# Patient Record
Sex: Male | Born: 1975 | Race: White | Hispanic: No | Marital: Married | Smoking: Never smoker
Health system: Southern US, Community
[De-identification: ages and names within clinical notes are randomized; demographics above are authoritative.]

## PROBLEM LIST (undated history)

## (undated) DIAGNOSIS — C8111 Nodular sclerosis classical Hodgkin lymphoma, lymph nodes of head, face, and neck: Secondary | ICD-10-CM

## (undated) DIAGNOSIS — Z789 Other specified health status: Secondary | ICD-10-CM

## (undated) HISTORY — PX: NO PAST SURGERIES: SHX2092

---

## 2004-06-21 ENCOUNTER — Emergency Department (HOSPITAL_COMMUNITY): Admission: EM | Admit: 2004-06-21 | Discharge: 2004-06-21 | Payer: Self-pay | Admitting: Family Medicine

## 2005-01-29 ENCOUNTER — Emergency Department (HOSPITAL_COMMUNITY): Admission: EM | Admit: 2005-01-29 | Discharge: 2005-01-29 | Payer: Self-pay | Admitting: Family Medicine

## 2005-02-20 ENCOUNTER — Ambulatory Visit: Payer: Self-pay | Admitting: Family Medicine

## 2005-04-18 ENCOUNTER — Ambulatory Visit: Payer: Self-pay | Admitting: Family Medicine

## 2005-04-24 ENCOUNTER — Ambulatory Visit: Payer: Self-pay

## 2005-04-24 ENCOUNTER — Encounter: Payer: Self-pay | Admitting: Cardiology

## 2005-06-28 ENCOUNTER — Ambulatory Visit: Payer: Self-pay | Admitting: Family Medicine

## 2005-09-12 ENCOUNTER — Ambulatory Visit: Payer: Self-pay | Admitting: Family Medicine

## 2005-09-19 ENCOUNTER — Ambulatory Visit: Payer: Self-pay | Admitting: Family Medicine

## 2005-10-19 ENCOUNTER — Ambulatory Visit: Payer: Self-pay | Admitting: Family Medicine

## 2005-11-27 ENCOUNTER — Ambulatory Visit: Payer: Self-pay | Admitting: Family Medicine

## 2006-04-24 ENCOUNTER — Ambulatory Visit: Payer: Self-pay | Admitting: Family Medicine

## 2006-04-24 LAB — CONVERTED CEMR LAB
Basophils Absolute: 0 10*3/uL (ref 0.0–0.1)
Basophils Relative: 0.6 % (ref 0.0–1.0)
Hemoglobin: 15.1 g/dL (ref 13.0–17.0)
MCV: 88.2 fL (ref 78.0–100.0)
Monocytes Relative: 11 % (ref 3.0–11.0)
Neutro Abs: 2.6 10*3/uL (ref 1.4–7.7)
Platelets: 266 10*3/uL (ref 150–400)
RBC: 4.85 M/uL (ref 4.22–5.81)
WBC: 5.4 10*3/uL (ref 4.5–10.5)

## 2006-04-26 ENCOUNTER — Encounter: Admission: RE | Admit: 2006-04-26 | Discharge: 2006-04-26 | Payer: Self-pay | Admitting: Family Medicine

## 2006-05-22 ENCOUNTER — Ambulatory Visit: Payer: Self-pay | Admitting: Family Medicine

## 2007-04-29 ENCOUNTER — Emergency Department (HOSPITAL_COMMUNITY): Admission: EM | Admit: 2007-04-29 | Discharge: 2007-04-29 | Payer: Self-pay | Admitting: Emergency Medicine

## 2013-12-04 ENCOUNTER — Ambulatory Visit: Payer: Self-pay | Admitting: Family Medicine

## 2016-03-26 DIAGNOSIS — C8111 Nodular sclerosis classical Hodgkin lymphoma, lymph nodes of head, face, and neck: Secondary | ICD-10-CM

## 2016-03-26 HISTORY — DX: Nodular sclerosis Hodgkin lymphoma, lymph nodes of head, face, and neck: C81.11

## 2016-04-06 ENCOUNTER — Other Ambulatory Visit: Payer: Self-pay | Admitting: Family Medicine

## 2016-04-06 DIAGNOSIS — R221 Localized swelling, mass and lump, neck: Secondary | ICD-10-CM

## 2016-04-06 DIAGNOSIS — R222 Localized swelling, mass and lump, trunk: Secondary | ICD-10-CM

## 2016-04-08 DIAGNOSIS — D509 Iron deficiency anemia, unspecified: Secondary | ICD-10-CM | POA: Insufficient documentation

## 2016-04-08 DIAGNOSIS — R030 Elevated blood-pressure reading, without diagnosis of hypertension: Secondary | ICD-10-CM | POA: Insufficient documentation

## 2016-04-09 ENCOUNTER — Ambulatory Visit: Payer: Self-pay

## 2016-04-10 ENCOUNTER — Ambulatory Visit
Admission: RE | Admit: 2016-04-10 | Discharge: 2016-04-10 | Disposition: A | Discharge status: Home / Self Care | Payer: BLUE CROSS/BLUE SHIELD | Source: Ambulatory Visit | Attending: Family Medicine | Admitting: Family Medicine

## 2016-04-10 ENCOUNTER — Encounter: Payer: Self-pay | Admitting: *Deleted

## 2016-04-10 ENCOUNTER — Other Ambulatory Visit: Payer: Self-pay | Admitting: Family Medicine

## 2016-04-10 ENCOUNTER — Other Ambulatory Visit: Payer: Self-pay | Admitting: Oncology

## 2016-04-10 DIAGNOSIS — R222 Localized swelling, mass and lump, trunk: Secondary | ICD-10-CM

## 2016-04-10 DIAGNOSIS — R221 Localized swelling, mass and lump, neck: Secondary | ICD-10-CM | POA: Diagnosis present

## 2016-04-10 DIAGNOSIS — K118 Other diseases of salivary glands: Secondary | ICD-10-CM | POA: Diagnosis not present

## 2016-04-10 DIAGNOSIS — R590 Localized enlarged lymph nodes: Secondary | ICD-10-CM | POA: Diagnosis not present

## 2016-04-10 MED ORDER — IOPAMIDOL (ISOVUE-300) INJECTION 61%
75.0000 mL | Freq: Once | INTRAVENOUS | Status: AC | PRN
  Administered 2016-04-10: 75 mL via INTRAVENOUS

## 2016-04-11 NOTE — Discharge Instructions (Signed)

## 2016-04-13 ENCOUNTER — Encounter: Payer: Self-pay | Admitting: Otolaryngology

## 2016-04-13 ENCOUNTER — Ambulatory Visit
Admission: RE | Admit: 2016-04-13 | Discharge: 2016-04-13 | Disposition: A | Discharge status: Home / Self Care | Payer: BLUE CROSS/BLUE SHIELD | Source: Ambulatory Visit | Attending: Otolaryngology | Admitting: Otolaryngology

## 2016-04-13 ENCOUNTER — Ambulatory Visit: Payer: BLUE CROSS/BLUE SHIELD | Admitting: Anesthesiology

## 2016-04-13 ENCOUNTER — Encounter
Admission: RE | Disposition: A | Discharge status: Home / Self Care | Payer: Self-pay | Source: Ambulatory Visit | Attending: Otolaryngology

## 2016-04-13 DIAGNOSIS — Z87891 Personal history of nicotine dependence: Secondary | ICD-10-CM | POA: Insufficient documentation

## 2016-04-13 DIAGNOSIS — R590 Localized enlarged lymph nodes: Secondary | ICD-10-CM | POA: Insufficient documentation

## 2016-04-13 HISTORY — PX: LYMPH NODE BIOPSY: SHX201

## 2016-04-13 HISTORY — DX: Other specified health status: Z78.9

## 2016-04-13 SURGERY — LYMPH NODE BIOPSY
Anesthesia: General | Laterality: Right | Wound class: Clean

## 2016-04-13 MED ORDER — GLYCOPYRROLATE 0.2 MG/ML IJ SOLN
INTRAMUSCULAR | Status: DC | PRN
  Administered 2016-04-13: 0.2 mg via INTRAVENOUS

## 2016-04-13 MED ORDER — ONDANSETRON HCL 4 MG/2ML IJ SOLN: 4.0000 mg | Freq: Once | INTRAMUSCULAR | Status: DC | PRN

## 2016-04-13 MED ORDER — FENTANYL CITRATE (PF) 100 MCG/2ML IJ SOLN: 25.0000 ug | INTRAMUSCULAR | Status: DC | PRN

## 2016-04-13 MED ORDER — OXYCODONE HCL 5 MG/5ML PO SOLN: 5.0000 mg | Freq: Once | ORAL | Status: DC | PRN

## 2016-04-13 MED ORDER — ONDANSETRON HCL 4 MG/2ML IJ SOLN
INTRAMUSCULAR | Status: DC | PRN
  Administered 2016-04-13: 4 mg via INTRAVENOUS

## 2016-04-13 MED ORDER — SUCCINYLCHOLINE CHLORIDE 20 MG/ML IJ SOLN
INTRAMUSCULAR | Status: DC | PRN
  Administered 2016-04-13: 100 mg via INTRAVENOUS

## 2016-04-13 MED ORDER — ROCURONIUM BROMIDE 100 MG/10ML IV SOLN
INTRAVENOUS | Status: DC | PRN
  Administered 2016-04-13: 10 mg via INTRAVENOUS

## 2016-04-13 MED ORDER — PROPOFOL 10 MG/ML IV BOLUS
INTRAVENOUS | Status: DC | PRN
  Administered 2016-04-13: 100 mg via INTRAVENOUS

## 2016-04-13 MED ORDER — FENTANYL CITRATE (PF) 100 MCG/2ML IJ SOLN
INTRAMUSCULAR | Status: DC | PRN
  Administered 2016-04-13: 100 ug via INTRAVENOUS

## 2016-04-13 MED ORDER — MIDAZOLAM HCL 5 MG/5ML IJ SOLN
INTRAMUSCULAR | Status: DC | PRN
  Administered 2016-04-13: 2 mg via INTRAVENOUS

## 2016-04-13 MED ORDER — LIDOCAINE-EPINEPHRINE 2 %-1:100000 IJ SOLN
INTRAMUSCULAR | Status: DC | PRN
  Administered 2016-04-13: 5.5 mL

## 2016-04-13 MED ORDER — LACTATED RINGERS IV SOLN
INTRAVENOUS | Status: DC
  Administered 2016-04-13: 12:00:00 via INTRAVENOUS

## 2016-04-13 MED ORDER — DEXAMETHASONE SODIUM PHOSPHATE 4 MG/ML IJ SOLN
INTRAMUSCULAR | Status: DC | PRN
  Administered 2016-04-13: 8 mg via INTRAVENOUS

## 2016-04-13 MED ORDER — LIDOCAINE HCL (CARDIAC) 20 MG/ML IV SOLN
INTRAVENOUS | Status: DC | PRN
  Administered 2016-04-13: 50 mg via INTRATRACHEAL

## 2016-04-13 MED ORDER — OXYCODONE HCL 5 MG PO TABS: 5.0000 mg | ORAL_TABLET | Freq: Once | ORAL | Status: DC | PRN

## 2016-04-13 SURGICAL SUPPLY — 39 items
BLADE SURG 15 STRL LF DISP TIS (BLADE) ×1 IMPLANT
BLADE SURG 15 STRL SS (BLADE) ×2
CNTNR SPEC 2.5X3XGRAD LEK (MISCELLANEOUS) ×1
CONT SPEC 4OZ STER OR WHT (MISCELLANEOUS) ×2
CONTAINER SPEC 2.5X3XGRAD LEK (MISCELLANEOUS) ×1 IMPLANT
CORD BIP STRL DISP 12FT (MISCELLANEOUS) IMPLANT
DRAPE MAG INST 16X20 L/F (DRAPES) ×3 IMPLANT
DRESSING TELFA 4X3 1S ST N-ADH (GAUZE/BANDAGES/DRESSINGS) ×6 IMPLANT
DRSG TEGADERM 2-3/8X2-3/4 SM (GAUZE/BANDAGES/DRESSINGS) IMPLANT
DRSG TEGADERM 4X4.75 (GAUZE/BANDAGES/DRESSINGS) IMPLANT
ELECT CAUTERY BLADE TIP 2.5 (TIP)
ELECT NEEDLE 20X.3 GREEN (MISCELLANEOUS) ×3
ELECT REM PT RETURN 9FT ADLT (ELECTROSURGICAL)
ELECTRODE CAUTERY BLDE TIP 2.5 (TIP) IMPLANT
ELECTRODE NEEDLE 20X.3 GREEN (MISCELLANEOUS) ×1 IMPLANT
ELECTRODE REM PT RTRN 9FT ADLT (ELECTROSURGICAL) IMPLANT
GLOVE BIO SURGEON STRL SZ7.5 (GLOVE) ×3 IMPLANT
GLOVE PROTEXIS LATEX SZ 7.5 (GLOVE) IMPLANT
GOWN STRL REUS W/ TWL LRG LVL3 (GOWN DISPOSABLE) ×2 IMPLANT
GOWN STRL REUS W/TWL LRG LVL3 (GOWN DISPOSABLE) ×4
HARMONIC SCALPEL FOCUS (MISCELLANEOUS) ×3 IMPLANT
LABEL OR SOLS (LABEL) IMPLANT
NS IRRIG 500ML POUR BTL (IV SOLUTION) ×3 IMPLANT
PACK HEAD/NECK (MISCELLANEOUS) ×3 IMPLANT
PROBE MONO 100X0.75 ELECT 1.9M (MISCELLANEOUS) ×3 IMPLANT
PROBE NEUROSIGN BIPOL (MISCELLANEOUS) ×1 IMPLANT
PROBE NEUROSIGN BIPOLAR (MISCELLANEOUS) ×2
SOL PREP PVP 2OZ (MISCELLANEOUS) ×3
SOLUTION PREP PVP 2OZ (MISCELLANEOUS) ×1 IMPLANT
SPONGE KITTNER 5P (MISCELLANEOUS) ×3 IMPLANT
SPONGE XRAY 4X4 16PLY STRL (MISCELLANEOUS) ×3 IMPLANT
SUT ETHILON 5-0 FS-2 18 BLK (SUTURE) IMPLANT
SUT ETHILON 6 0 9-3 1X18 BLK (SUTURE) IMPLANT
SUT PROLENE 5 0 P 3 (SUTURE) ×3 IMPLANT
SUT SILK 2 0 (SUTURE)
SUT SILK 2-0 18XBRD TIE 12 (SUTURE) IMPLANT
SUT SILK 3 0 REEL (SUTURE) IMPLANT
SUT VIC AB 4-0 PS2 18 (SUTURE) IMPLANT
SUT VIC AB 4-0 RB1 18 (SUTURE) ×3 IMPLANT

## 2016-04-13 NOTE — Anesthesia Postprocedure Evaluation (Signed)
Anesthesia Post Note  Patient: Wyatt Bates  Procedure(s) Performed: Procedure(s) (LRB): biopsy of lymph nodes open deep cervical node (right) (Right)  Patient location during evaluation: PACU Anesthesia Type: General Level of consciousness: awake and alert Pain management: pain level controlled Vital Signs Assessment: post-procedure vital signs reviewed and stable Respiratory status: spontaneous breathing, nonlabored ventilation, respiratory function stable and patient connected to nasal cannula oxygen Cardiovascular status: blood pressure returned to baseline and stable Postop Assessment: no signs of nausea or vomiting Anesthetic complications: no    Alisa Graff

## 2016-04-13 NOTE — Transfer of Care (Signed)
Immediate Anesthesia Transfer of Care Note  Patient: Wyatt Bates  Procedure(s) Performed: Procedure(s): biopsy of lymph nodes open deep cervical node (right) (Right)  Patient Location: PACU  Anesthesia Type: General  Level of Consciousness: awake, alert  and patient cooperative  Airway and Oxygen Therapy: Patient Spontanous Breathing and Patient connected to supplemental oxygen  Post-op Assessment: Post-op Vital signs reviewed, Patient's Cardiovascular Status Stable, Respiratory Function Stable, Patent Airway and No signs of Nausea or vomiting  Post-op Vital Signs: Reviewed and stable  Complications: No apparent anesthesia complications

## 2016-04-13 NOTE — Anesthesia Procedure Notes (Addendum)
Procedure Name: Intubation Performed by: Londell Moh Pre-anesthesia Checklist: Patient identified, Emergency Drugs available, Suction available, Patient being monitored and Timeout performed Patient Re-evaluated:Patient Re-evaluated prior to inductionOxygen Delivery Method: Circle system utilized Preoxygenation: Pre-oxygenation with 100% oxygen Intubation Type: IV induction, Rapid sequence and Cricoid Pressure applied Laryngoscope Size: Glidescope and 3 Grade View: Grade I Tube type: Oral Rae Tube size: 7.5 mm Number of attempts: 1 Airway Equipment and Method: Rigid stylet Placement Confirmation: ETT inserted through vocal cords under direct vision,  positive ETCO2 and breath sounds checked- equal and bilateral Tube secured with: Tape Dental Injury: Teeth and Oropharynx as per pre-operative assessment  Difficulty Due To: Difficulty was anticipated and Difficult Airway- due to limited oral opening Comments: Pt trachea deviated to left. og placed stomach decompressed.

## 2016-04-13 NOTE — H&P (Signed)
H&P has been reviewed and added heart and lung exam were normal. To be downloaded later.

## 2016-04-13 NOTE — Anesthesia Preprocedure Evaluation (Signed)
Anesthesia Evaluation  Patient identified by MRN, date of birth, ID band Patient awake    Reviewed: Allergy & Precautions, H&P , NPO status , Patient's Chart, lab work & pertinent test results, reviewed documented beta blocker date and time   Airway Mallampati: II  TM Distance: >3 FB Neck ROM: full    Dental no notable dental hx.    Pulmonary former smoker,    Pulmonary exam normal breath sounds clear to auscultation       Cardiovascular Exercise Tolerance: Good negative cardio ROS   Rhythm:regular Rate:Normal     Neuro/Psych negative neurological ROS  negative psych ROS   GI/Hepatic negative GI ROS, Neg liver ROS,   Endo/Other  negative endocrine ROS  Renal/GU negative Renal ROS  negative genitourinary   Musculoskeletal   Abdominal   Peds  Hematology negative hematology ROS (+)   Anesthesia Other Findings   Reproductive/Obstetrics negative OB ROS                             Anesthesia Physical Anesthesia Plan  ASA: II  Anesthesia Plan: General   Post-op Pain Management:    Induction:   Airway Management Planned:   Additional Equipment:   Intra-op Plan:   Post-operative Plan:   Informed Consent: I have reviewed the patients History and Physical, chart, labs and discussed the procedure including the risks, benefits and alternatives for the proposed anesthesia with the patient or authorized representative who has indicated his/her understanding and acceptance.   Dental Advisory Given  Plan Discussed with: CRNA  Anesthesia Plan Comments:         Anesthesia Quick Evaluation

## 2016-04-13 NOTE — Op Note (Signed)
.  04/13/2016  1:50 PM    Colt, Zysk  TL:[id number]   Pre-Op Dx:  Massive right neck lymphadenopathy with suspicion of lymphoma  Post-op Dx: Same  Proc: Right deep neck node biopsy   Surg:  [facility]  Anes:  GOT  EBL:  10 mL  Comp:  None  Findings:  Very enlarged lymph nodes well encapsulated that are matted together. 2 neck nodes from anterior and under the sternocleidomastoid were removed.  Procedure: The patient was given general anesthesia by oral endotracheal intubation. He is placed in a supine position with a shoulder roll were his neck was extended a little bed and had rotated to the left side. You feel lymph nodes scattered throughout the neck behind the sternocleidomastoid muscle and in front of it and under it from under the angle jaw down to the clavicle. He was prepped and draped in sterile fashion. 5-1/2 mL 2% lidocaine with epi 1-100,000 were used for infiltration around the skin for vasoconstriction. A skin crease was marked for a to have centimeter incision in a horizontal fashion.  The incision was created through skin and subcutaneous tissue down the platysma. This was divided with the Harmonic and the sternal cleidomastoid muscle was evident. The muscle fibers were freed up from the underlying lymph node and were pulled posteriorly to find a nice plane around the lymph node. The lymph node had a very dense capsule that was whitish. The dissection was carried all the way around it freed up the scar bands small vessels with the Harmonic scalpel. Inside got the dome and freed up and freed all around each side, you could see where it was adherent to the adjacent lymph nodes with a thick capsule. The capsule was divided to free up the outer lymph node without trying to remove all of the. I was able with the Harmonic to separate the lymph nodes was still an intact capsule on both sides. Once all the inferior attachments were freed up see another smaller lymph node that was  sticking into the hole with very little inferior attachments area these were freed up and a second lymph node was sent. The first lymph node was about 4 cm in length and 2-1/2 cm in diameter in a tube shape. The smaller lymph node was about 2 cm in length and 1 cm in diameter. When these were both removed from the area there was very little bleeding at all. Wound was irrigated copiously and no bleeding sites were noted.  A 10 TLS drain was placed through separate posterior stab incision. The platysma was closed with 40 Vicryls and then the subcutaneous was closed with 40 Vicryls as well. The skin edges were held together with a running locking 5-0 Prolene suture. The drain was placed to low continuous Vacutainer suction. The wound was covered with bacitracin, Telfa, and Tegaderm. The patient was awakened and taken to the recovery room in satisfactory condition. There were no operative complications.  Dispo:   To PACU to be discharged home  Plan:  To follow-up tomorrow for drain removal and changing his dressing. He will be given Vacutainer's to change that every 4 hours or sooner, if the Vacutainer gets one half full.  Melodye Swor H  04/13/2016 1:50 PM

## 2016-04-16 ENCOUNTER — Encounter: Payer: Self-pay | Admitting: Otolaryngology

## 2016-04-17 NOTE — Progress Notes (Signed)
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 Subsequent imaging and biopsy was consistent with Hodgkin's lymphoma. He otherwise has felt well. He denies any fevers, night sweats, or weight loss. He has no neurologic complaints. He denies any dysphagia or difficulty swallowing. He has no chest pain or shortness of breath. He denies any nausea, vomiting, constipation, or diarrhea. He has no urinary complaints. Patient otherwise feels well and offers no further specific complaints.  REVIEW OF SYSTEMS:   Review of Systems  Constitutional: Negative.  Negative for fever, malaise/fatigue and weight loss.  HENT: Negative.   Respiratory: Negative.  Negative for cough and shortness of breath.   Cardiovascular: Negative.  Negative for chest pain and leg swelling.  Gastrointestinal: Negative.  Negative for abdominal pain.  Genitourinary: Negative.   Musculoskeletal: Positive for neck pain.  Neurological: Negative.  Negative for weakness.  Psychiatric/Behavioral: The patient is nervous/anxious.     As per HPI. Otherwise, a complete review of systems is negative.  PAST MEDICAL HISTORY: Past Medical History:  Diagnosis Date  . Medical history non-contributory     PAST SURGICAL HISTORY: Past Surgical History:  Procedure Laterality Date  . LYMPH NODE BIOPSY Right 04/13/2016   Procedure: biopsy of lymph nodes open deep cervical node (right);  Surgeon: Margaretha Sheffield, MD;  Location: [facility];  Service: ENT;  Laterality: Right;  . NO PAST SURGERIES      FAMILY HISTORY: No family history on file.  ADVANCED DIRECTIVES (Y/N):  N  HEALTH  MAINTENANCE: Social History  Substance Use Topics  . Smoking status: Former Research scientist (life sciences)  . Smokeless tobacco: Former Systems developer    Types: Snuff    Quit date: 05/25/2015     Comment: social in college  . Alcohol use 1.2 oz/week    2 Glasses of wine per week     Colonoscopy:  PAP:  Bone density:  Lipid panel:  No Known Allergies  No current outpatient prescriptions on file.   No current facility-administered medications for this visit.     OBJECTIVE: There were no vitals filed for this visit.   There is no height or weight on file to calculate BMI.    ECOG FS:0 - Asymptomatic  General: Well-developed, well-nourished, no acute distress. Eyes: Pink conjunctiva, anicteric sclera. HEENT: Large easily palpable right neck mass consistent with lymphadenopathy. Lungs: Clear to auscultation bilaterally. Heart: Regular rate and rhythm. No rubs, murmurs, or gallops. Abdomen: Soft, nontender, nondistended. No organomegaly noted, normoactive bowel sounds. Musculoskeletal: No edema, cyanosis, or clubbing. Neuro: Alert, answering all questions appropriately. Cranial nerves grossly intact. Skin: No rashes or petechiae noted. Psych: Normal affect. Lymphatics: No cervical, calvicular, axillary or inguinal LAD.   LAB RESULTS:  No results found for: NA, K, CL, CO2, GLUCOSE, BUN, CREATININE, CALCIUM, PROT, ALBUMIN, AST, ALT, ALKPHOS, BILITOT, GFRNONAA, GFRAA  Lab Results  Component Value Date   WBC 5.4 04/24/2006   NEUTROABS 2.6 04/24/2006   HGB 15.1 04/24/2006   HCT 42.8 04/24/2006   MCV 88.2 04/24/2006   PLT 266 04/24/2006     STUDIES: Ct Soft Tissue Neck W Contrast  Result Date: 04/10/2016 CLINICAL DATA:  Subcutaneous mass in the supraclavicular area. EXAM: CT NECK WITH  CONTRAST TECHNIQUE: Multidetector CT imaging of the neck was performed using the standard protocol following the bolus administration of intravenous contrast. CONTRAST:  47m ISOVUE-300 IOPAMIDOL (ISOVUE-300) INJECTION 61%  COMPARISON:  None. FINDINGS: Pharynx and larynx: Negative for mass or swelling. The central compartment is displaced to the left by bulky cervical lymphadenopathy. No thickening of the tonsillar tissue. Salivary glands: 2 or 3 foci of gas within the right parotid without inflammatory changes. Thyroid: Negative Lymph nodes: Bulky lymphadenopathy throughout the right cervical chain extending from the parotid tail to the supraclavicular fossa. The largest node is at the level of the hyoid and measures up to 42 mm in diameter. The nodes are homogeneously enhancing and rounded. No contralateral lymphadenopathy. Vascular: Effacement and displacement of the right internal jugular vein without thrombosis. Negative arterial structures. Limited intracranial: Negative Visualized orbits: Negative Mastoids and visualized paranasal sinuses: Clear Skeleton: No acute or aggressive process. Upper chest: Reported separately These results will be called to the ordering clinician or representative by the Radiologist Assistant, and communication documented in the PACS or zVision Dashboard. IMPRESSION: 1. Bulky lymphadenopathy in the right neck consistent with lymphoma. Adenopathy extends from the parotid tail to the supraclavicular fossa and causes leftward displacement of the airway. 2. Right pneumoparotid. Electronically Signed   By: JMonte FantasiaM.D.   On: 04/10/2016 11:25   Ct Chest W Contrast  Result Date: 04/10/2016 CLINICAL DATA:  Enlarging right supraclavicular neck mass. EXAM: CT CHEST WITH CONTRAST TECHNIQUE: Multidetector CT imaging of the chest was performed during intravenous contrast administration. CONTRAST:  75 mL Isovue 300 COMPARISON:  None. FINDINGS: Cardiovascular:  No acute findings. Mediastinum/Nodes: Right lower jugular and supraclavicular lymphadenopathy is incompletely visualized on this exam. 10 mm subcarinal mediastinal lymph node seen on image 57/4. A 10 mm mediastinal lymph node is also seen along the  anterior aspect of the lower thoracic esophagus on image 107/4. No evidence of hilar or axillary lymphadenopathy. Lungs/Pleura: No pulmonary infiltrate or mass identified. No effusion present. Upper Abdomen:  Unremarkable. Musculoskeletal:  No suspicious bone lesions. IMPRESSION: Incompletely visualized right lower jugular and supraclavicular lymphadenopathy. See separate neck CT report from today. 10 mm subcarinal and lower paraesophageal mediastinal lymph nodes. No other lymphadenopathy or soft tissue masses identified within the thorax. Electronically Signed   By: JEarle GellM.D.   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 Patient has at least stage II disease, but will get a PET scan as well as bone marrow biopsy in the next 1-2 weeks to complete the staging workup. Given the bulky nature of his neck mass, he likely will benefit from chemotherapy using ABVD. Patient will also require MUGA scan and PFTs in preparation for treatment. Patient will return to clinic one week after his biopsy to discuss the results and treatment planning.  Approximately 45 minutes was spent in discussion of which greater than 50% was consultation.  Patient expressed understanding and was in agreement with this plan. He also understands that He can call clinic at any time with any questions, concerns, or complaints.   Cancer Staging No matching staging information was found for the patient.  TLloyd Huger MD   04/17/2016 10:50 PM

## 2016-04-18 ENCOUNTER — Ambulatory Visit (INDEPENDENT_AMBULATORY_CARE_PROVIDER_SITE_OTHER): Payer: BLUE CROSS/BLUE SHIELD | Admitting: Primary Care

## 2016-04-18 ENCOUNTER — Encounter: Payer: Self-pay | Admitting: Primary Care

## 2016-04-18 ENCOUNTER — Inpatient Hospital Stay: Payer: BLUE CROSS/BLUE SHIELD | Attending: Oncology | Admitting: Oncology

## 2016-04-18 ENCOUNTER — Encounter: Payer: Self-pay | Admitting: Oncology

## 2016-04-18 VITALS — BP 149/95 | HR 91 | Temp 97.6°F | Resp 18 | Ht 66.5 in | Wt 213.0 lb

## 2016-04-18 DIAGNOSIS — R221 Localized swelling, mass and lump, neck: Secondary | ICD-10-CM | POA: Diagnosis not present

## 2016-04-18 DIAGNOSIS — C8111 Nodular sclerosis classical Hodgkin lymphoma, lymph nodes of head, face, and neck: Secondary | ICD-10-CM

## 2016-04-18 DIAGNOSIS — Z87891 Personal history of nicotine dependence: Secondary | ICD-10-CM | POA: Insufficient documentation

## 2016-04-18 DIAGNOSIS — R030 Elevated blood-pressure reading, without diagnosis of hypertension: Secondary | ICD-10-CM | POA: Diagnosis not present

## 2016-04-18 DIAGNOSIS — C8591 Non-Hodgkin lymphoma, unspecified, lymph nodes of head, face, and neck: Secondary | ICD-10-CM | POA: Insufficient documentation

## 2016-04-18 DIAGNOSIS — K118 Other diseases of salivary glands: Secondary | ICD-10-CM | POA: Insufficient documentation

## 2016-04-18 NOTE — Assessment & Plan Note (Signed)
Historical elevated readings in MD office's. Overall stable home readings. Will allow him to work on lifestyle changes, long discussion today regarding what that entailed. Info provided regarding DASH diet. Follow up in 2 months for re-evaluation.

## 2016-04-18 NOTE — Patient Instructions (Signed)
Check your blood pressure daily, around the same time of day.  Ensure that you have rested for 30 minutes prior to checking your blood pressure. Take note of your readings.  Schedule a follow up visit in 2 months for re-evaluation of your blood pressure.  Continue to work on improvements in your diet. Increase vegetables, fruit, whole grains, water.  Start exercising. You should be getting 150 minutes of moderate intensity exercise weekly.  Ensure you are consuming 64 ounces of water daily.  Take a look at the information below.  It was a pleasure to meet you today! Please don't hesitate to call me with any questions. Welcome to [facility]!  DASH Eating Plan DASH stands for "Dietary Approaches to Stop Hypertension." The DASH eating plan is a healthy eating plan that has been shown to reduce high blood pressure (hypertension). Additional health benefits may include reducing the risk of type 2 diabetes mellitus, heart disease, and stroke. The DASH eating plan may also help with weight loss. What do I need to know about the DASH eating plan? For the DASH eating plan, you will follow these general guidelines:  Choose foods with less than 150 milligrams of sodium per serving (as listed on the food label).  Use salt-free seasonings or herbs instead of table salt or sea salt.  Check with your health care provider or pharmacist before using salt substitutes.  Eat lower-sodium products. These are often labeled as "low-sodium" or "no salt added."  Eat fresh foods. Avoid eating a lot of canned foods.  Eat more vegetables, fruits, and low-fat dairy products.  Choose whole grains. Look for the word "whole" as the first word in the ingredient list.  Choose fish and skinless chicken or [place] more often than red meat. Limit fish, poultry, and meat to 6 oz (170 g) each day.  Limit sweets, desserts, sugars, and sugary drinks.  Choose heart-healthy fats.  Eat more home-cooked food and less  restaurant, buffet, and fast food.  Limit fried foods.  Do not fry foods. Cook foods using methods such as baking, boiling, grilling, and broiling instead.  When eating at a restaurant, ask that your food be prepared with less salt, or no salt if possible. What foods can I eat? Seek help from a dietitian for individual calorie needs. Grains  Whole grain or whole wheat bread. Brown rice. Whole grain or whole wheat pasta. Quinoa, bulgur, and whole grain cereals. Low-sodium cereals. Corn or whole wheat flour tortillas. Whole grain cornbread. Whole grain crackers. Low-sodium crackers. Vegetables  Fresh or frozen vegetables (raw, steamed, roasted, or grilled). Low-sodium or reduced-sodium tomato and vegetable juices. Low-sodium or reduced-sodium tomato sauce and paste. Low-sodium or reduced-sodium canned vegetables. Fruits  All fresh, canned (in natural juice), or frozen fruits. Meat and Other Protein Products  Ground beef (85% or leaner), grass-fed beef, or beef trimmed of fat. Skinless chicken or [place]. Ground chicken or [place]. Pork trimmed of fat. All fish and seafood. Eggs. Dried beans, peas, or lentils. Unsalted nuts and seeds. Unsalted canned beans. Dairy  Low-fat dairy products, such as skim or 1% milk, 2% or reduced-fat cheeses, low-fat ricotta or cottage cheese, or plain low-fat yogurt. Low-sodium or reduced-sodium cheeses. Fats and Oils  Tub margarines without trans fats. Light or reduced-fat mayonnaise and salad dressings (reduced sodium). Avocado. Safflower, olive, or canola oils. Natural peanut or almond butter. Other  Unsalted popcorn and pretzels. The items listed above may not be a complete list of recommended foods or beverages. Contact your  dietitian for more options.  What foods are not recommended? Grains  White bread. White pasta. White rice. Refined cornbread. Bagels and croissants. Crackers that contain trans fat. Vegetables  Creamed or fried vegetables. Vegetables in  a cheese sauce. Regular canned vegetables. Regular canned tomato sauce and paste. Regular tomato and vegetable juices. Fruits  Canned fruit in light or heavy syrup. Fruit juice. Meat and Other Protein Products  Fatty cuts of meat. Ribs, chicken wings, bacon, sausage, bologna, salami, chitterlings, fatback, hot dogs, bratwurst, and packaged luncheon meats. Salted nuts and seeds. Canned beans with salt. Dairy  Whole or 2% milk, cream, half-and-half, and cream cheese. Whole-fat or sweetened yogurt. Full-fat cheeses or blue cheese. Nondairy creamers and whipped toppings. Processed cheese, cheese spreads, or cheese curds. Condiments  Onion and garlic salt, seasoned salt, table salt, and sea salt. Canned and packaged gravies. Worcestershire sauce. Tartar sauce. Barbecue sauce. Teriyaki sauce. Soy sauce, including reduced sodium. Steak sauce. Fish sauce. Oyster sauce. Cocktail sauce. Horseradish. Ketchup and mustard. Meat flavorings and tenderizers. Bouillon cubes. Hot sauce. Tabasco sauce. Marinades. Taco seasonings. Relishes. Fats and Oils  Butter, stick margarine, lard, shortening, ghee, and bacon fat. Coconut, palm kernel, or palm oils. Regular salad dressings. Other  Pickles and olives. Salted popcorn and pretzels. The items listed above may not be a complete list of foods and beverages to avoid. Contact your dietitian for more information.  Where can I find more information? National Heart, Lung, and Blood Institute: travelstabloid.com This information is not intended to replace advice given to you by your health care provider. Make sure you discuss any questions you have with your health care provider. Document Released: 03/01/2011 Document Revised: 08/18/2015 Document Reviewed: 01/14/2013 Elsevier Interactive Patient Education  2017 [employer].

## 2016-04-18 NOTE — Progress Notes (Signed)
Subjective:    Patient ID: Wyatt Bates, male    DOB: May 02, 1975, 41 y.o.   MRN: [id number]  HPI  Wyatt Bates is a 41 year old male who presents today to establish care and discuss the problems mentioned below. Will obtain old records.  1) Elevated Blood Pressure Reading: History of elevated readings in doctor's offices mostly. He has a strong family history of hypertension in both brothers and father. He was managed on medication in the past for about 1 year during his wife's pregnancy. He checks his BP at work and gets readings of 130's/80-90's on average.  He's recently changed his diet and is very motivated to lose weight. He denies chest pain, dizziness, visual changes.  2) Neck Mass: Located to right neck that was first noticed for several years ago. Over the years he's noticed increased swelling so several weeks ago he presented to urgent care for further evaluation.  He underwent surgery for lymph node removal Friday last week. He is meeting with an oncologist later today for further evaluation of possible of lymphoma. He is currently managed on Norco PRN.  Review of Systems  Constitutional: Negative for fever and unexpected weight change.  HENT: Negative for congestion.   Eyes: Negative for visual disturbance.  Respiratory: Negative for shortness of breath.   Cardiovascular: Negative for chest pain.  Neurological: Negative for dizziness and headaches.  Hematological: Positive for adenopathy.       Past Medical History:  Diagnosis Date  . Medical history non-contributory      Social History   Social History  . Marital status: Married    Spouse name: N/A  . Number of children: N/A  . Years of education: N/A   Occupational History  . Not on file.   Social History Main Topics  . Smoking status: Former Research scientist (life sciences)  . Smokeless tobacco: Former Systems developer    Types: Snuff    Quit date: 05/25/2015     Comment: social in college  . Alcohol use 1.2 oz/week    2 Glasses of wine per  week  . Drug use: Unknown  . Sexual activity: Not on file   Other Topics Concern  . Not on file   Social History Narrative   Married.   2 children.   Works in [employer].   Enjoys golf, baseball, hunting, spending time with family.    Past Surgical History:  Procedure Laterality Date  . LYMPH NODE BIOPSY Right 04/13/2016   Procedure: biopsy of lymph nodes open deep cervical node (right);  Surgeon: Margaretha Sheffield, MD;  Location: [place];  Service: ENT;  Laterality: Right;  . NO PAST SURGERIES      Family History  Problem Relation Age of Onset  . Heart disease Father   . COPD Father   . Hypertension Father   . Hypertension Brother   . Alzheimer's disease Maternal Grandmother   . Pancreatic cancer Maternal Grandfather     No Known Allergies  No current outpatient prescriptions on file prior to visit.   No current facility-administered medications on file prior to visit.     BP (!) 144/92   Pulse 92   Temp 98.1 F (36.7 C) (Oral)   Ht 5' 6.5" (1.689 m)   Wt 213 lb (96.6 kg)   SpO2 97%   BMI 33.86 kg/m    Objective:   Physical Exam  Constitutional: He is oriented to person, place, and time. He appears well-nourished.  Neck: Neck supple.  Cardiovascular:  Normal rate and regular rhythm.   Pulmonary/Chest: Effort normal and breath sounds normal. He has no wheezes. He has no rales.  Lymphadenopathy:    He has cervical adenopathy.  Neurological: He is alert and oriented to person, place, and time.  Skin: Skin is warm and dry.  Psychiatric: He has a normal mood and affect.          Assessment & Plan:

## 2016-04-18 NOTE — Assessment & Plan Note (Signed)
Removal of 2 right sided lymph nodes Friday last week. Will be seeing oncologist today for further evaluation. Surgical site appears well.

## 2016-04-18 NOTE — Progress Notes (Signed)
Patient is here for new patient appointment.  

## 2016-04-18 NOTE — Progress Notes (Signed)
Pre visit review using our clinic review tool, if applicable. No additional management support is needed unless otherwise documented below in the visit note. 

## 2016-04-20 ENCOUNTER — Other Ambulatory Visit: Payer: Self-pay | Admitting: Pathology

## 2016-04-20 LAB — SURGICAL PATHOLOGY

## 2016-04-22 DIAGNOSIS — C8111 Nodular sclerosis classical Hodgkin lymphoma, lymph nodes of head, face, and neck: Secondary | ICD-10-CM | POA: Insufficient documentation

## 2016-04-24 ENCOUNTER — Ambulatory Visit (HOSPITAL_COMMUNITY): Payer: BLUE CROSS/BLUE SHIELD

## 2016-04-24 ENCOUNTER — Encounter: Payer: Self-pay | Admitting: Otolaryngology

## 2016-04-24 DIAGNOSIS — R911 Solitary pulmonary nodule: Secondary | ICD-10-CM | POA: Diagnosis not present

## 2016-04-24 DIAGNOSIS — C8111 Nodular sclerosis classical Hodgkin lymphoma, lymph nodes of head, face, and neck: Secondary | ICD-10-CM

## 2016-04-24 DIAGNOSIS — R590 Localized enlarged lymph nodes: Secondary | ICD-10-CM | POA: Diagnosis not present

## 2016-04-25 ENCOUNTER — Ambulatory Visit
Admission: RE | Admit: 2016-04-25 | Discharge: 2016-04-25 | Disposition: A | Discharge status: Home / Self Care | Payer: BLUE CROSS/BLUE SHIELD | Source: Ambulatory Visit | Attending: Oncology | Admitting: Oncology

## 2016-04-25 DIAGNOSIS — R590 Localized enlarged lymph nodes: Secondary | ICD-10-CM | POA: Insufficient documentation

## 2016-04-25 DIAGNOSIS — C8111 Nodular sclerosis classical Hodgkin lymphoma, lymph nodes of head, face, and neck: Secondary | ICD-10-CM | POA: Diagnosis not present

## 2016-04-25 DIAGNOSIS — R911 Solitary pulmonary nodule: Secondary | ICD-10-CM | POA: Insufficient documentation

## 2016-04-25 LAB — GLUCOSE, CAPILLARY: GLUCOSE-CAPILLARY: 95 mg/dL (ref 65–99)

## 2016-04-25 MED ORDER — FLUDEOXYGLUCOSE F - 18 (FDG) INJECTION
12.0000 | Freq: Once | INTRAVENOUS | Status: AC | PRN
  Administered 2016-04-25: 12.54 via INTRAVENOUS

## 2016-04-30 ENCOUNTER — Other Ambulatory Visit: Payer: Self-pay | Admitting: Oncology

## 2016-04-30 ENCOUNTER — Telehealth: Payer: Self-pay | Admitting: *Deleted

## 2016-04-30 NOTE — Telephone Encounter (Signed)
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 Will discuss details at next clinic visit.    ----- Message ----- From: Telford Nab, RN Sent: 04/30/2016  12:34 PM To: Lloyd Huger, MD  Pt would like PET results prior to appt. Please let me know what you want me to tell him. Thanks!  ----- Message ----- From: Reeves Dam Sent: 04/30/2016  11:59 AM To: Telford Nab, RN  Pt is calling inquiring about the PET scan he calls me quite often. I told him I would ask you to find out if we can tell him results he is very anxious

## 2016-04-30 NOTE — Telephone Encounter (Signed)
Pt has been made aware of PET results per Dr. Grayland Ormond.

## 2016-04-30 NOTE — Progress Notes (Signed)
START ON PATHWAY REGIMEN - Lymphoma and CLL  LYOS310: ABVD q28 Days x 2 Cycles Followed by PET-2   A cycle is every 28 days:     Doxorubicin (Adriamycin(R)) 25 mg/m2 IV push on days 1 and 15. Dose Mod: None     Dacarbazine 375 mg/m2 in 250 mL D5W IV over 60 minutes on days 1 and 15. Dose Mod: None     Vinblastine (Velban(R)) 6 mg/m2 in 50 ml NS IV over 20 minutes on days 1 and 15. Dose Mod: None     Bleomycin (Blenoxane(R)) 10 mg/m2 in 50 mL NS IV over 20 minutes on days 1 and 15. Dose Mod: None Additional Orders: Give bleomycin 1 mg subcut test dose prior to first dose only. Observe pt for 1 hour; if no adverse reaction, proceed with scheduled dose. Bleomycin: 1 unit = 1 mg. PFTs at baseline and as clinically indicated. Ref:  Hollie [place] al. Alison Stalling J Med. 2012 Feb 2;366(5):399-408.  **Always confirm dose/schedule in your pharmacy ordering system**    Patient Characteristics: Classic Hodgkin Lymphoma, First Line, Stage III / IV Disease Type: Classic Hodgkin Lymphoma Disease Type: Not Applicable Line of therapy: First Line [place] Stage: IIIA  Intent of Therapy: Curative Intent, Discussed with Patient

## 2016-05-01 ENCOUNTER — Encounter: Payer: Self-pay | Admitting: *Deleted

## 2016-05-01 ENCOUNTER — Ambulatory Visit
Admission: RE | Admit: 2016-05-01 | Discharge: 2016-05-01 | Disposition: A | Discharge status: Home / Self Care | Payer: BLUE CROSS/BLUE SHIELD | Source: Ambulatory Visit | Attending: Oncology | Admitting: Oncology

## 2016-05-01 DIAGNOSIS — C8111 Nodular sclerosis classical Hodgkin lymphoma, lymph nodes of head, face, and neck: Secondary | ICD-10-CM

## 2016-05-01 MED ORDER — TECHNETIUM TC 99M-LABELED RED BLOOD CELLS IV KIT
20.0000 | PACK | Freq: Once | INTRAVENOUS | Status: AC | PRN
  Administered 2016-05-01: 19.38 via INTRAVENOUS

## 2016-05-02 ENCOUNTER — Other Ambulatory Visit: Payer: Self-pay | Admitting: Radiology

## 2016-05-03 ENCOUNTER — Ambulatory Visit
Admission: RE | Admit: 2016-05-03 | Discharge: 2016-05-03 | Disposition: A | Discharge status: Home / Self Care | Payer: BLUE CROSS/BLUE SHIELD | Source: Ambulatory Visit | Attending: Oncology | Admitting: Oncology

## 2016-05-03 ENCOUNTER — Encounter: Payer: Self-pay | Admitting: Oncology

## 2016-05-03 DIAGNOSIS — C8111 Nodular sclerosis classical Hodgkin lymphoma, lymph nodes of head, face, and neck: Secondary | ICD-10-CM | POA: Insufficient documentation

## 2016-05-03 DIAGNOSIS — Z87891 Personal history of nicotine dependence: Secondary | ICD-10-CM | POA: Diagnosis not present

## 2016-05-03 LAB — CBC
HCT: 36.1 % — ABNORMAL LOW (ref 40.0–52.0)
Hemoglobin: 12.1 g/dL — ABNORMAL LOW (ref 13.0–18.0)
MCH: 24.6 pg — AB (ref 26.0–34.0)
MCHC: 33.6 g/dL (ref 32.0–36.0)
MCV: 73.2 fL — AB (ref 80.0–100.0)
PLATELETS: 381 10*3/uL (ref 150–440)
RBC: 4.93 MIL/uL (ref 4.40–5.90)
RDW: 16.8 % — ABNORMAL HIGH (ref 11.5–14.5)
WBC: 6.7 10*3/uL (ref 3.8–10.6)

## 2016-05-03 LAB — PROTIME-INR
INR: 1.05
PROTHROMBIN TIME: 13.7 s (ref 11.4–15.2)

## 2016-05-03 LAB — DIFFERENTIAL
BASOS ABS: 0 10*3/uL (ref 0–0.1)
BASOS PCT: 0 %
EOS ABS: 0.1 10*3/uL (ref 0–0.7)
Eosinophils Relative: 2 %
LYMPHS ABS: 0.9 10*3/uL — AB (ref 1.0–3.6)
Lymphocytes Relative: 14 %
MONO ABS: 0.9 10*3/uL (ref 0.2–1.0)
MONOS PCT: 14 %
Neutro Abs: 4.7 10*3/uL (ref 1.4–6.5)
Neutrophils Relative %: 70 %

## 2016-05-03 LAB — APTT: aPTT: 33 seconds (ref 24–36)

## 2016-05-03 MED ORDER — FENTANYL CITRATE (PF) 100 MCG/2ML IJ SOLN
INTRAMUSCULAR | Status: AC | PRN
  Administered 2016-05-03 (×2): 50 ug via INTRAVENOUS

## 2016-05-03 MED ORDER — SODIUM CHLORIDE 0.9 % IV SOLN
INTRAVENOUS | Status: DC
  Administered 2016-05-03: 08:00:00 via INTRAVENOUS

## 2016-05-03 MED ORDER — MIDAZOLAM HCL 2 MG/2ML IJ SOLN
INTRAMUSCULAR | Status: AC | PRN
  Administered 2016-05-03 (×2): 1 mg via INTRAVENOUS

## 2016-05-03 NOTE — Discharge Instructions (Signed)
Needle Biopsy of the Bone °Introduction °A bone biopsy is a procedure in which a small sample of bone is removed. The sample is taken with a needle. Then, the bone sample is looked at under a microscope to check for abnormalities. The sample is usually taken from a bone that is close to the skin. This procedure may be done to check for various problems with the bone. You may need this procedure if imaging tests or blood tests have indicated a possible problem. This procedure may be done to help determine if a bone tumor is cancerous (malignant). A bone biopsy can help to diagnose problems such as: °· Tumors of the bone (sarcomas) and bone marrow (multiple myeloma). °· Bone that forms abnormally (Paget disease). °· Noncancerous (benign) bone cysts. °· Bony growths. °· Infections in the bone. °Tell a health care provider about: °· Any allergies you have. °· All medicines you are taking, including vitamins, herbs, eye drops, creams, and over-the-counter medicines. °· Any problems you or family members have had with anesthetic medicines. °· Any blood disorders you have. °· Any surgeries you have had. °· Any medical conditions you have. °What are the risks? °Generally, this is a safe procedure. However, problems may occur, including: °· Excessive bleeding. °· Infection. °· Injury to surrounding tissue. °What happens before the procedure? °· Ask your health care provider about: °¨ Changing or stopping your regular medicines. This is especially important if you are taking diabetes medicines or blood thinners. °¨ Taking medicines such as aspirin and ibuprofen. These medicines can thin your blood. Do not take these medicines before your procedure if your health care provider instructs you not to. °· Follow instructions from your health care provider about eating or drinking restrictions. °· Plan to have someone take you home after the procedure. °· If you go home right after the procedure, plan to have someone with you for  24 hours. °What happens during the procedure? °· An IV tube may be inserted into one of your veins. °· The injection site will be cleaned with a germ-killing solution (antiseptic). °· You will be given one or more of the following: °¨ A medicine to help you relax (sedative). °¨ A medicine to numb the area (local anesthetic). °· The sample of bone will be removed by putting a large needle through the skin and into the bone. °· The needle will be removed. °· A bandage (dressing) will be placed over the insertion site and taped in place. °The procedure may vary among health care providers and hospitals. °What happens after the procedure? °· Your blood pressure, heart rate, breathing rate, and blood oxygen level will be monitored often until the medicines you were given have worn off. °· Return to your normal activities as told by your health care provider. °This information is not intended to replace advice given to you by your health care provider. Make sure you discuss any questions you have with your health care provider. °Document Released: 01/19/2004 Document Revised: 08/18/2015 Document Reviewed: 04/19/2014 °© 2017 Elsevier ° °

## 2016-05-03 NOTE — Procedures (Signed)
Interventional Radiology Procedure Note  Procedure: CT guided aspirate and core biopsy of right iliac bone Complications: None Recommendations: - Bedrest supine x 1 hrs - Follow biopsy results  Criselda Starke T. Janai Maudlin, M.D Pager:  [phone]   

## 2016-05-03 NOTE — H&P (Signed)
Chief Complaint: Patient was seen in consultation today for bone marrow biopsy at the request of Finnegan,Timothy J  Referring Physician(s): Finnegan,Timothy J  Patient Status: [facility] - Out-pt  History of Present Illness: Wyatt Bates is a 41 y.o. 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  He is currently asymptomatic.  Past Medical History:  Diagnosis Date  . Medical history non-contributory     Past Surgical History:  Procedure Laterality Date  . LYMPH NODE BIOPSY Right 04/13/2016   Procedure: biopsy of lymph nodes open deep cervical node (right);  Surgeon: Margaretha Sheffield, MD;  Location: [place];  Service: ENT;  Laterality: Right;  . NO PAST SURGERIES      Allergies: Patient has no known allergies.  Medications: Prior to Admission medications   Medication Sig Start Date End Date Taking? Authorizing Provider  HYDROcodone-acetaminophen (NORCO/VICODIN) 5-325 MG tablet Take 1 tablet by mouth every 6 (six) hours as needed.  04/13/16   Historical Provider, MD     Family History  Problem Relation Age of Onset  . Heart disease Father   . COPD Father   . Hypertension Father   . Hypertension Brother   . Alzheimer's disease Maternal Grandmother   . Pancreatic cancer Maternal Grandfather     Social History   Social History  . Marital status: Married    Spouse name: N/A  . Number of children: N/A  . Years of education: N/A   Social History Main Topics  . Smoking status: Former Research scientist (life sciences)  . Smokeless tobacco: Former Systems developer    Types: Snuff    Quit date: 05/25/2015     Comment: social in college  . Alcohol use 1.2 oz/week    2 Glasses of wine per week  . Drug use: No  . Sexual activity: Not Asked   Other Topics Concern  . None   Social History Narrative   Married.   2 children.   Works in [employer].   Enjoys golf, baseball,  hunting, spending time with family.    ECOG Status: 0 - Asymptomatic  Review of Systems: A 12 point ROS discussed and pertinent positives are indicated in the HPI above.  All other systems are negative.  Review of Systems  Constitutional: Negative.   HENT: Negative.   Respiratory: Negative.   Cardiovascular: Negative.   Gastrointestinal: Negative.   Genitourinary: Negative.   Musculoskeletal: Negative.   Neurological: Negative.   Hematological: Positive for adenopathy.    Vital Signs: BP (!) 145/96   Ht 5' 6.5" (1.689 m)   Wt 212 lb (96.2 kg)   SpO2 95%   BMI 33.71 kg/m   Physical Exam  Constitutional: He is oriented to person, place, and time. He appears well-developed and well-nourished. No distress.  HENT:  Head: Normocephalic and atraumatic.  Neck: No JVD present.  Cardiovascular: Normal rate, regular rhythm and normal heart sounds.  Exam reveals no gallop and no friction rub.   No murmur heard. Pulmonary/Chest: Effort normal and breath sounds normal. No stridor. No respiratory distress. He has no wheezes. He has no rales.  Abdominal: Soft. Bowel sounds are normal. He exhibits no distension and no mass. There is no tenderness. There is no rebound and no guarding.  Musculoskeletal: He exhibits no edema.  Lymphadenopathy:    He has cervical adenopathy.  Neurological: He is alert and oriented to person, place,  and time.  Skin: Skin is warm and dry. He is not diaphoretic.  Vitals reviewed.   Mallampati Score:  MD Evaluation Airway: WNL Heart: WNL Abdomen: WNL Chest/ Lungs: WNL ASA  Classification: 3, 2 Mallampati/Airway Score: One  Imaging: Ct Soft Tissue Neck W Contrast  Result Date: 04/10/2016 CLINICAL DATA:  Subcutaneous mass in the supraclavicular area. EXAM: CT NECK WITH CONTRAST TECHNIQUE: Multidetector CT imaging of the neck was performed using the standard protocol following the bolus administration of intravenous contrast. CONTRAST:  19m ISOVUE-300  IOPAMIDOL (ISOVUE-300) INJECTION 61% COMPARISON:  None. FINDINGS: Pharynx and larynx: Negative for mass or swelling. The central compartment is displaced to the left by bulky cervical lymphadenopathy. No thickening of the tonsillar tissue. Salivary glands: 2 or 3 foci of gas within the right parotid without inflammatory changes. Thyroid: Negative Lymph nodes: Bulky lymphadenopathy throughout the right cervical chain extending from the parotid tail to the supraclavicular fossa. The largest node is at the level of the hyoid and measures up to 42 mm in diameter. The nodes are homogeneously enhancing and rounded. No contralateral lymphadenopathy. Vascular: Effacement and displacement of the right internal jugular vein without thrombosis. Negative arterial structures. Limited intracranial: Negative Visualized orbits: Negative Mastoids and visualized paranasal sinuses: Clear Skeleton: No acute or aggressive process. Upper chest: Reported separately These results will be called to the ordering clinician or representative by the Radiologist Assistant, and communication documented in the PACS or zVision Dashboard. IMPRESSION: 1. Bulky lymphadenopathy in the right neck consistent with lymphoma. Adenopathy extends from the parotid tail to the supraclavicular fossa and causes leftward displacement of the airway. 2. Right pneumoparotid. Electronically Signed   By: JMonte FantasiaM.D.   On: 04/10/2016 11:25   Ct Chest W Contrast  Result Date: 04/10/2016 CLINICAL DATA:  Enlarging right supraclavicular neck mass. EXAM: CT CHEST WITH CONTRAST TECHNIQUE: Multidetector CT imaging of the chest was performed during intravenous contrast administration. CONTRAST:  75 mL Isovue 300 COMPARISON:  None. FINDINGS: Cardiovascular:  No acute findings. Mediastinum/Nodes: Right lower jugular and supraclavicular lymphadenopathy is incompletely visualized on this exam. 10 mm subcarinal mediastinal lymph node seen on image 57/4. A 10 mm  mediastinal lymph node is also seen along the anterior aspect of the lower thoracic esophagus on image 107/4. No evidence of hilar or axillary lymphadenopathy. Lungs/Pleura: No pulmonary infiltrate or mass identified. No effusion present. Upper Abdomen:  Unremarkable. Musculoskeletal:  No suspicious bone lesions. IMPRESSION: Incompletely visualized right lower jugular and supraclavicular lymphadenopathy. See separate neck CT report from today. 10 mm subcarinal and lower paraesophageal mediastinal lymph nodes. No other lymphadenopathy or soft tissue masses identified within the thorax. Electronically Signed   By: JEarle GellM.D.   On: 04/10/2016 11:11   Nm Cardiac Muga Rest  Result Date: 05/01/2016 CLINICAL DATA:  Hodgkin's lymphoma, high risk chemotherapy EXAM: NUCLEAR MEDICINE CARDIAC BLOOD POOL IMAGING (MUGA) TECHNIQUE: Cardiac multi-gated acquisition was performed at rest following intravenous injection of Tc-995mabeled red blood cells. RADIOPHARMACEUTICALS:  19.38 mCi Tc-9980mrtechnetate in-vitro labeled red blood cells IV COMPARISON:  None FINDINGS: Calculated LEFT ventricular ejection fraction is 67%, within the normal range. Study was obtained at a cardiac rate 88 bpm. Normal LEFT ventricular wall motion identified on cine analysis. Patient was rhythmic during imaging. IMPRESSION: Normal LEFT ventricular ejection fraction of 67%. Normal LV wall motion. Electronically Signed   By: MarLavonia DanaD.   On: 05/01/2016 15:02   Nm Pet Image Initial (pi) Skull Base To Thigh  Result Date:  04/25/2016 CLINICAL DATA:  Initial treatment strategy for nodular sclerosing Hodgkin lymphoma diagnosed on recent right neck lymph node excisional biopsy. EXAM: NUCLEAR MEDICINE PET SKULL BASE TO THIGH TECHNIQUE: 12.5 mCi F-18 FDG was injected intravenously. Full-ring PET imaging was performed from the skull base to thigh after the radiotracer. CT data was obtained and used for attenuation correction and anatomic  localization. FASTING BLOOD GLUCOSE:  Value: 95 mg/dl COMPARISON:  04/10/2016 neck and chest CT. FINDINGS: NECK There is bulky hypermetabolic confluent right neck lymphadenopathy involving levels 2 through 5, with representative right neck nodes as follows: - right level 2 neck 4.0 cm node with max SUV 18.6 (series 4/ image 42) - right level 3 neck 2.2 cm node with max SUV 11.7 (series 4/ image 51) - right level 4 neck 1.9 cm node with max SUV 16.2 (series 4/ image 62) - right level 5 neck 1.6 cm node with max SUV 9.4 (series 4/ image 51) There is a hypermetabolic minimally prominent 0.8 cm right suboccipital node with max SUV 3.8 (series 4/ image 22). There is a mildly hypermetabolic minimally prominent 0.6 cm left level 4 neck lymph node with max SUV 3.0 (series 4/image 64). No additional hypermetabolic left neck lymph nodes. CHEST No pleural or pericardial effusions. No hypermetabolic axillary or hilar lymphadenopathy. There is hypermetabolic subcarinal lymphadenopathy measuring up to 1.2 cm with max SUV 5.2 (series 4/ image 89). No additional hypermetabolic mediastinal nodes. Subpleural 4 mm right pulmonary nodule associated with the minor fissure is below PET resolution (series 4/ image 95). No acute consolidative airspace disease, lung masses or additional significant pulmonary nodules. No pneumothorax. There is focal hypermetabolism at the left lateral T12 vertebral margin with max SUV 12.1, without discrete CT correlate. ABDOMEN/PELVIS Nonenlarged hypermetabolic right retrocrural nodes measuring up to 0.6 cm with max SUV 5.1 (series 4/image 145). Nonenlarged hypermetabolic left para-aortic nodes measuring up to 0.6 cm with max SUV 4.0 (series 4/image 184). Nonenlarged 0.6 cm hypermetabolic left common iliac node with max SUV 3.3 (series 4/image 212). No additional hypermetabolic or enlarged abdominopelvic nodes. No abnormal hypermetabolic activity within the liver, pancreas, or spleen. Normal size spleen.  Symmetric mild adrenal hypermetabolism without discrete adrenal nodules, favoring mild adrenal hyperplasia. Simple 1.7 cm anterior upper right renal cyst. Nonspecific internal prostatic calcifications. Nonspecific hypermetabolism at the anorectal junction without CT correlate. SKELETON No focal hypermetabolic activity to suggest skeletal metastasis. IMPRESSION: 1. Hypermetabolic neck and thoracic lymphadenopathy, including bulky confluent hypermetabolic right neck lymphadenopathy predominantly involving nodal levels 2 through 5 with additional small hypermetabolic right suboccipital lymph node, solitary small hypermetabolic left neck lymph node at nodal level 4, and hypermetabolic subcarinal lymphadenopathy. 2. Nonenlarged hypermetabolic right retrocrural, retroperitoneal and left pelvic lymph nodes and hypermetabolism at the left lateral T12 vertebral margin, suspicious for additional sites of lymphoma. 3. Normal size spleen.  No splenic hypermetabolism. 4. Subpleural 4 mm right pulmonary nodule associated with the minor fissure, below PET resolution, probably benign. 5. Nonspecific hypermetabolism at the anorectal junction without CT correlate, potentially physiologic, recommend correlation with directed clinical history and exam. Electronically Signed   By: Ilona Sorrel M.D.   On: 04/25/2016 12:25    Labs:  CBC:  Recent Labs  05/03/16 0715  WBC 6.7  HGB 12.1*  HCT 36.1*  PLT 381    COAGS:  Recent Labs  05/03/16 0715  INR 1.05  APTT 33    Assessment and Plan:  For CT guided bone marrow biopsy today.  Risks and benefits discussed  with the patient including, but not limited to bleeding, infection, damage to adjacent structures or low yield requiring additional tests. All of the patient's questions were answered, patient is agreeable to proceed.  Consent signed and in chart.  Thank you for this interesting consult.  I greatly enjoyed meeting RUDI BUNYARD and look forward to  participating in their care.  A copy of this report was sent to the requesting provider on this date.  Electronically SignedAletta Edouard T 05/03/2016, 7:57 AM     I spent a total of 30 Minutes in face to face in clinical consultation, greater than 50% of which was counseling/coordinating care for bone marrow biopsy.

## 2016-05-09 ENCOUNTER — Other Ambulatory Visit: Payer: Self-pay | Admitting: Oncology

## 2016-05-09 NOTE — Progress Notes (Signed)
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 He continues to have increased swelling of his right neck. He continues to be anxious. He otherwise has felt well. He denies any fevers, night sweats, or weight loss. He has no neurologic complaints. He denies any dysphagia or difficulty swallowing. He has no chest pain or shortness of breath. He denies any nausea, vomiting, constipation, or diarrhea. He has no urinary complaints. Patient otherwise feels well and offers no further specific complaints.  REVIEW OF SYSTEMS:   Review of Systems  Constitutional: Negative.  Negative for fever, malaise/fatigue and weight loss.  HENT: Negative.   Respiratory: Negative.  Negative for cough and shortness of breath.   Cardiovascular: Negative.  Negative for chest pain and leg swelling.  Gastrointestinal: Negative.  Negative for abdominal pain.  Genitourinary: Negative.   Musculoskeletal: Positive for neck pain.  Neurological: Negative.  Negative for weakness.  Psychiatric/Behavioral: The patient is nervous/anxious.     As per HPI. Otherwise, a complete review of systems is negative.  PAST MEDICAL HISTORY: Past Medical History:  Diagnosis Date  . Medical history non-contributory     PAST SURGICAL HISTORY: Past Surgical History:  Procedure Laterality Date  . LYMPH NODE BIOPSY Right 04/13/2016   Procedure: biopsy of lymph nodes open deep cervical node (right);  Surgeon: Margaretha Sheffield, MD;  Location: [facility];  Service: ENT;  Laterality: Right;  . NO PAST SURGERIES      FAMILY HISTORY: Family History    Problem Relation Age of Onset  . Heart disease Father   . COPD Father   . Hypertension Father   . Hypertension Brother   . Alzheimer's disease Maternal Grandmother   . Pancreatic cancer Maternal Grandfather     ADVANCED DIRECTIVES (Y/N):  N  HEALTH MAINTENANCE: Social History  Substance Use Topics  . Smoking status: Former Research scientist (life sciences)  . Smokeless tobacco: Former Systems developer    Types: Snuff    Quit date: 05/25/2015     Comment: social in college  . Alcohol use 1.2 oz/week    2 Glasses of wine per week     Colonoscopy:  PAP:  Bone density:  Lipid panel:  No Known Allergies  Current Outpatient Prescriptions  Medication Sig Dispense Refill  . lidocaine-prilocaine (EMLA) cream Apply to affected area once 30 g 3  . ondansetron (ZOFRAN) 8 MG tablet Take 1 tablet (8 mg total) by mouth 2 (two) times daily as needed. 30 tablet 1  . prochlorperazine (COMPAZINE) 10 MG tablet Take 1 tablet (10 mg total) by mouth every 6 (six) hours as needed (Nausea or vomiting). 30 tablet 1   No current facility-administered medications for this visit.     OBJECTIVE: Vitals:   05/10/16 1355  BP: (!) 145/75  Pulse: (!) 112  Temp: 99 F (37.2 C)     Body mass index is 32.77 kg/m.    ECOG FS:0 - Asymptomatic  General: Well-developed, well-nourished, no acute distress. Eyes: Pink conjunctiva, anicteric sclera. HEENT: Large easily palpable right neck mass consistent with lymphadenopathy. Lungs: Clear to auscultation bilaterally. Heart: Regular rate and rhythm. No rubs, murmurs, or gallops. Abdomen: Soft, nontender, nondistended. No organomegaly noted,  normoactive bowel sounds. Musculoskeletal: No edema, cyanosis, or clubbing. Neuro: Alert, answering all questions appropriately. Cranial nerves grossly intact. Skin: No rashes or petechiae noted. Psych: Normal affect. Lymphatics: No cervical, calvicular, axillary or inguinal LAD.   LAB RESULTS:  No results found for: NA, K, CL, CO2, GLUCOSE, BUN,  CREATININE, CALCIUM, PROT, ALBUMIN, AST, ALT, ALKPHOS, BILITOT, GFRNONAA, GFRAA  Lab Results  Component Value Date   WBC 6.7 05/03/2016   NEUTROABS 4.7 05/03/2016   HGB 12.1 (L) 05/03/2016   HCT 36.1 (L) 05/03/2016   MCV 73.2 (L) 05/03/2016   PLT 381 05/03/2016     STUDIES: Nm Cardiac Muga Rest  Result Date: 05/01/2016 CLINICAL DATA:  Hodgkin's lymphoma, high risk chemotherapy EXAM: NUCLEAR MEDICINE CARDIAC BLOOD POOL IMAGING (MUGA) TECHNIQUE: Cardiac multi-gated acquisition was performed at rest following intravenous injection of Tc-72mlabeled red blood cells. RADIOPHARMACEUTICALS:  19.38 mCi Tc-942mertechnetate in-vitro labeled red blood cells IV COMPARISON:  None FINDINGS: Calculated LEFT ventricular ejection fraction is 67%, within the normal range. Study was obtained at a cardiac rate 88 bpm. Normal LEFT ventricular wall motion identified on cine analysis. Patient was rhythmic during imaging. IMPRESSION: Normal LEFT ventricular ejection fraction of 67%. Normal LV wall motion. Electronically Signed   By: MaLavonia Dana.D.   On: 05/01/2016 15:02   Nm Pet Image Initial (pi) Skull Base To Thigh  Result Date: 04/25/2016 CLINICAL DATA:  Initial treatment strategy for nodular sclerosing Hodgkin lymphoma diagnosed on recent right neck lymph node excisional biopsy. EXAM: NUCLEAR MEDICINE PET SKULL BASE TO THIGH TECHNIQUE: 12.5 mCi F-18 FDG was injected intravenously. Full-ring PET imaging was performed from the skull base to thigh after the radiotracer. CT data was obtained and used for attenuation correction and anatomic localization. FASTING BLOOD GLUCOSE:  Value: 95 mg/dl COMPARISON:  04/10/2016 neck and chest CT. FINDINGS: NECK There is bulky hypermetabolic confluent right neck lymphadenopathy involving levels 2 through 5, with representative right neck nodes as follows: - right level 2 neck 4.0 cm node with max SUV 18.6 (series 4/ image 42) - right level 3 neck 2.2 cm node with max SUV 11.7  (series 4/ image 51) - right level 4 neck 1.9 cm node with max SUV 16.2 (series 4/ image 62) - right level 5 neck 1.6 cm node with max SUV 9.4 (series 4/ image 51) There is a hypermetabolic minimally prominent 0.8 cm right suboccipital node with max SUV 3.8 (series 4/ image 22). There is a mildly hypermetabolic minimally prominent 0.6 cm left level 4 neck lymph node with max SUV 3.0 (series 4/image 64). No additional hypermetabolic left neck lymph nodes. CHEST No pleural or pericardial effusions. No hypermetabolic axillary or hilar lymphadenopathy. There is hypermetabolic subcarinal lymphadenopathy measuring up to 1.2 cm with max SUV 5.2 (series 4/ image 89). No additional hypermetabolic mediastinal nodes. Subpleural 4 mm right pulmonary nodule associated with the minor fissure is below PET resolution (series 4/ image 95). No acute consolidative airspace disease, lung masses or additional significant pulmonary nodules. No pneumothorax. There is focal hypermetabolism at the left lateral T12 vertebral margin with max SUV 12.1, without discrete CT correlate. ABDOMEN/PELVIS Nonenlarged hypermetabolic right retrocrural nodes measuring up to 0.6 cm with max SUV 5.1 (series 4/image 145). Nonenlarged hypermetabolic left para-aortic nodes measuring up to 0.6 cm with max SUV 4.0 (series 4/image 184). Nonenlarged 0.6 cm hypermetabolic left common iliac node with max SUV 3.3 (series 4/image 212). No additional hypermetabolic or enlarged abdominopelvic nodes. No abnormal hypermetabolic activity within the  liver, pancreas, or spleen. Normal size spleen. Symmetric mild adrenal hypermetabolism without discrete adrenal nodules, favoring mild adrenal hyperplasia. Simple 1.7 cm anterior upper right renal cyst. Nonspecific internal prostatic calcifications. Nonspecific hypermetabolism at the anorectal junction without CT correlate. SKELETON No focal hypermetabolic activity to suggest skeletal metastasis. IMPRESSION: 1. Hypermetabolic  neck and thoracic lymphadenopathy, including bulky confluent hypermetabolic right neck lymphadenopathy predominantly involving nodal levels 2 through 5 with additional small hypermetabolic right suboccipital lymph node, solitary small hypermetabolic left neck lymph node at nodal level 4, and hypermetabolic subcarinal lymphadenopathy. 2. Nonenlarged hypermetabolic right retrocrural, retroperitoneal and left pelvic lymph nodes and hypermetabolism at the left lateral T12 vertebral margin, suspicious for additional sites of lymphoma. 3. Normal size spleen.  No splenic hypermetabolism. 4. Subpleural 4 mm right pulmonary nodule associated with the minor fissure, below PET resolution, probably benign. 5. Nonspecific hypermetabolism at the anorectal junction without CT correlate, potentially physiologic, recommend correlation with directed clinical history and exam. Electronically Signed   By: Ilona Sorrel M.D.   On: 04/25/2016 12:25   Ct Biopsy  Result Date: 05/03/2016 CLINICAL DATA:  New diagnosis of nodular sclerosing classical Hodgkin's lymphoma. Bone marrow biopsy required for staging purposes. EXAM: CT GUIDED BONE MARROW ASPIRATION AND BIOPSY ANESTHESIA/SEDATION: Versed 2.0 mg IV, Fentanyl 100 mcg IV Total Moderate Sedation Time:  18 minutes. The patient's level of consciousness and physiologic status were continuously monitored during the procedure by Radiology nursing. PROCEDURE: The procedure risks, benefits, and alternatives were explained to the patient. Questions regarding the procedure were encouraged and answered. The patient understands and consents to the procedure. A time out was performed prior to initiating the procedure. The right gluteal region was prepped with chlorhexidine. Sterile gown and sterile gloves were used for the procedure. Local anesthesia was provided with 1% Lidocaine. Under CT guidance, an 11 gauge On Control bone cutting needle was advanced from a posterior approach into the right  iliac bone. Needle positioning was confirmed with CT. Initial non heparinized and heparinized aspirate samples were obtained of bone marrow. Core biopsy was performed via the On Control drill needle. COMPLICATIONS: None FINDINGS: Inspection of initial aspirate did reveal visible particles. Intact core biopsy sample was obtained. IMPRESSION: CT guided bone marrow biopsy of right posterior iliac bone with both aspirate and core samples obtained. Electronically Signed   By: Aletta Edouard M.D.   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 Bone marrow biopsy is negative. MUGA scan and PFTs are adequate to proceed with treatment. Patient will return to clinic on May 17, 2016 for further evaluation. He will receive chemotherapy with ABVD following day. He will also likely require OnPro Neulasta support. Patient will receive treatment on days 1 and 15 for a total of 6 cycles.   Approximately 30 minutes was spent in discussion of which greater than 50% was consultation.  Patient expressed understanding and was in agreement with this plan. He also understands that He can call clinic at any time with any questions, concerns, or complaints.   Cancer Staging Nodular sclerosis Hodgkin lymphoma of lymph nodes of neck (HCC) Staging form: Hodgkin and Non-Hodgkin Lymphoma, AJCC 8th Edition - Clinical stage from 05/09/2016: Stage III (Hodgkin lymphoma, A - Asymptomatic) - Signed by Lloyd Huger, MD on 05/09/2016  Lloyd Huger, MD   05/12/2016 4:38 PM

## 2016-05-10 ENCOUNTER — Encounter: Payer: Self-pay | Admitting: Oncology

## 2016-05-10 ENCOUNTER — Inpatient Hospital Stay: Payer: BLUE CROSS/BLUE SHIELD | Attending: Oncology | Admitting: Oncology

## 2016-05-10 VITALS — BP 145/75 | HR 112 | Temp 99.0°F | Ht 68.0 in | Wt 215.5 lb

## 2016-05-10 DIAGNOSIS — Z800 Family history of malignant neoplasm of digestive organs: Secondary | ICD-10-CM | POA: Insufficient documentation

## 2016-05-10 DIAGNOSIS — R590 Localized enlarged lymph nodes: Secondary | ICD-10-CM | POA: Insufficient documentation

## 2016-05-10 DIAGNOSIS — Z87891 Personal history of nicotine dependence: Secondary | ICD-10-CM | POA: Diagnosis not present

## 2016-05-10 DIAGNOSIS — Z79899 Other long term (current) drug therapy: Secondary | ICD-10-CM | POA: Insufficient documentation

## 2016-05-10 DIAGNOSIS — Z5111 Encounter for antineoplastic chemotherapy: Secondary | ICD-10-CM | POA: Insufficient documentation

## 2016-05-10 DIAGNOSIS — C8111 Nodular sclerosis classical Hodgkin lymphoma, lymph nodes of head, face, and neck: Secondary | ICD-10-CM | POA: Insufficient documentation

## 2016-05-10 DIAGNOSIS — F419 Anxiety disorder, unspecified: Secondary | ICD-10-CM | POA: Diagnosis not present

## 2016-05-10 DIAGNOSIS — R911 Solitary pulmonary nodule: Secondary | ICD-10-CM | POA: Diagnosis not present

## 2016-05-10 MED ORDER — ONDANSETRON HCL 8 MG PO TABS: 8.0000 mg | ORAL_TABLET | Freq: Two times a day (BID) | ORAL | 1 refills | Status: DC | PRN

## 2016-05-10 MED ORDER — PROCHLORPERAZINE MALEATE 10 MG PO TABS: 10.0000 mg | ORAL_TABLET | Freq: Four times a day (QID) | ORAL | 1 refills | Status: DC | PRN

## 2016-05-10 MED ORDER — LIDOCAINE-PRILOCAINE 2.5-2.5 % EX CREA: TOPICAL_CREAM | CUTANEOUS | 3 refills | Status: DC

## 2016-05-14 ENCOUNTER — Other Ambulatory Visit (INDEPENDENT_AMBULATORY_CARE_PROVIDER_SITE_OTHER): Payer: Self-pay

## 2016-05-14 ENCOUNTER — Telehealth (INDEPENDENT_AMBULATORY_CARE_PROVIDER_SITE_OTHER): Payer: Self-pay

## 2016-05-14 MED ORDER — CEFAZOLIN IN D5W 1 GM/50ML IV SOLN: 1.0000 g | Freq: Once | INTRAVENOUS | Status: AC

## 2016-05-14 NOTE — Telephone Encounter (Signed)
Attempted to contact patient to schedule his port placement and had to leave a message for the patient to return my call.

## 2016-05-14 NOTE — Telephone Encounter (Signed)
Spoke with the patient and he is scheduled for 05/15/16.

## 2016-05-15 ENCOUNTER — Encounter
Admission: RE | Disposition: A | Discharge status: Home / Self Care | Payer: Self-pay | Source: Ambulatory Visit | Attending: Vascular Surgery

## 2016-05-15 ENCOUNTER — Other Ambulatory Visit: Payer: BLUE CROSS/BLUE SHIELD

## 2016-05-15 ENCOUNTER — Encounter: Payer: Self-pay | Admitting: *Deleted

## 2016-05-15 ENCOUNTER — Ambulatory Visit
Admission: RE | Admit: 2016-05-15 | Discharge: 2016-05-15 | Disposition: A | Discharge status: Home / Self Care | Payer: BLUE CROSS/BLUE SHIELD | Source: Ambulatory Visit | Attending: Vascular Surgery | Admitting: Vascular Surgery

## 2016-05-15 DIAGNOSIS — C8190 Hodgkin lymphoma, unspecified, unspecified site: Secondary | ICD-10-CM | POA: Diagnosis not present

## 2016-05-15 HISTORY — PX: PORTA CATH INSERTION: CATH118285

## 2016-05-15 SURGERY — PORTA CATH INSERTION
Anesthesia: Moderate Sedation

## 2016-05-15 MED ORDER — CEFAZOLIN IN D5W 1 GM/50ML IV SOLN
1.0000 g | Freq: Once | INTRAVENOUS | Status: AC
  Administered 2016-05-15: 1 g via INTRAVENOUS

## 2016-05-15 MED ORDER — FENTANYL CITRATE (PF) 100 MCG/2ML IJ SOLN
INTRAMUSCULAR | Status: AC
  Filled 2016-05-15: qty 2

## 2016-05-15 MED ORDER — FENTANYL CITRATE (PF) 100 MCG/2ML IJ SOLN
INTRAMUSCULAR | Status: DC | PRN
Start: 2016-05-15 — End: 2016-05-15
  Administered 2016-05-15 (×2): 50 ug via INTRAVENOUS
  Administered 2016-05-15: 100 ug via INTRAVENOUS
  Administered 2016-05-15: 50 ug via INTRAVENOUS
  Administered 2016-05-15: 100 ug via INTRAVENOUS

## 2016-05-15 MED ORDER — FENTANYL CITRATE (PF) 100 MCG/2ML IJ SOLN
INTRAMUSCULAR | Status: AC
Start: 2016-05-15 — End: ?
  Filled 2016-05-15: qty 2

## 2016-05-15 MED ORDER — LIDOCAINE-EPINEPHRINE (PF) 2 %-1:200000 IJ SOLN
INTRAMUSCULAR | Status: AC
  Filled 2016-05-15: qty 20

## 2016-05-15 MED ORDER — MIDAZOLAM HCL 2 MG/2ML IJ SOLN
INTRAMUSCULAR | Status: DC | PRN
  Administered 2016-05-15: 1 mg via INTRAVENOUS
  Administered 2016-05-15: 2 mg via INTRAVENOUS
  Administered 2016-05-15: 1 mg via INTRAVENOUS
  Administered 2016-05-15: 2 mg via INTRAVENOUS
  Administered 2016-05-15: 1 mg via INTRAVENOUS

## 2016-05-15 MED ORDER — HEPARIN (PORCINE) IN NACL 2-0.9 UNIT/ML-% IJ SOLN
INTRAMUSCULAR | Status: AC
  Filled 2016-05-15: qty 500

## 2016-05-15 MED ORDER — SODIUM CHLORIDE 0.9 % IV SOLN
INTRAVENOUS | Status: DC
  Administered 2016-05-15: 10 mL/h via INTRAVENOUS

## 2016-05-15 MED ORDER — MIDAZOLAM HCL 5 MG/5ML IJ SOLN
INTRAMUSCULAR | Status: AC
  Filled 2016-05-15: qty 5

## 2016-05-15 SURGICAL SUPPLY — 11 items
BAG DECANTER STRL (MISCELLANEOUS) ×3 IMPLANT
DRAPE INCISE IOBAN 66X45 STRL (DRAPES) ×3 IMPLANT
KIT PORT POWER 8FR ISP CVUE (Catheter) ×3 IMPLANT
NEEDLE ENTRY 21GA 7CM ECHOTIP (NEEDLE) ×3 IMPLANT
PACK ANGIOGRAPHY (CUSTOM PROCEDURE TRAY) ×3 IMPLANT
PREP CHG 10.5 TEAL (MISCELLANEOUS) ×3 IMPLANT
SET INTRO CAPELLA COAXIAL (SET/KITS/TRAYS/PACK) ×3 IMPLANT
SUT MNCRL AB 4-0 PS2 18 (SUTURE) ×3 IMPLANT
SUT PROLENE 0 CT 1 30 (SUTURE) ×3 IMPLANT
SUTURE VIC 3-0 (SUTURE) ×3 IMPLANT
TOWEL OR 17X26 4PK STRL BLUE (TOWEL DISPOSABLE) ×3 IMPLANT

## 2016-05-15 NOTE — H&P (Signed)
[facility] History & Physical Update  The patient was interviewed and re-examined.  The patient's previous History and Physical has been reviewed and is unchanged.  There is no change in the plan of care. We plan to proceed with the scheduled procedure.  Hortencia Pilar, MD  05/15/2016, 11:39 AM

## 2016-05-15 NOTE — Op Note (Signed)
OPERATIVE NOTE   PROCEDURE: 1. Placement of a left IJ Infuse-a-Port  PRE-OPERATIVE DIAGNOSIS: Hodgkin's lymphoma  POST-OPERATIVE DIAGNOSIS: Same  SURGEON: Katha Cabal M.D.  ANESTHESIA: Conscious sedation combined with 1% lidocaine with epinephrine  ESTIMATED BLOOD LOSS: Minimal   FINDING(S): 1.  Patent vein  SPECIMEN(S): None  INDICATIONS:   Wyatt Bates is a 41 y.o. male who presents with biopsy-proven Hodgkin's lymphoma.  He will require chemotherapy and therefore must have appropriate intravenous access. Infuse-a-Port is therefore being placed. Rest benefits of been reviewed all questions answered patient agrees to proceed.  DESCRIPTION: After obtaining full informed written consent, the patient was brought back to the special procedure suite and placed in the supine position. The patient's left neck and chest wall are prepped and draped in sterile fashion. Appropriate timeout was called.  Ultrasound is placed in a sterile sleeve, ultrasound is utilized to avoid vascular injury as well as secondary to lack of appropriate landmarks. The left internal jugular vein is identified. It is echolucent and homogeneous as well as easily compressible indicating patency. 1% lidocaine is infiltrated into the soft tissue at the base of the neck as well as on the chest wall.  Under direct ultrasound visualization Seldinger needle is inserted into the left internal jugular vein. J-wire is advanced under fluoroscopic guidance. A small counterincision was created at the wire insertion site. A transverse incision is created 2 fingerbreadths below the scapula and a pocket is fashioned using both blunt and sharp dissection. The pocket is tested for appropriate size with the hub of the Infuse-a-Port. The tunneling device is then used to pull the intravascular portion of the catheter from the pocket to the neck counterincision.  Dilator and peel-away sheath were then inserted over the wire and the  wire is removed. Catheter is then advanced into the venous system without difficulty. Peel-away sheath was then removed.  Catheter is then positioned under fluoroscopic guidance at the atrial caval junction. It is then transected connected to the hub and the hope is slipped into the subcutaneous pocket on the chest wall. The hub was then accessed percutaneously and aspirates easily and flushes well and is flushed with 30 cc of heparinized saline. The pocket incision is then closed in layers using interrupted 3-0 Vicryl for the subcutaneous tissues and 4-0 Monocryl subcuticular for skin closure. Dermabond is applied. The neck counterincision was closed with 4-0 Monocryl subcuticular and Dermabond as well.  The patient tolerated the procedure well and there were no immediate complications.  COMPLICATIONS: None  CONDITION: Unchanged  Katha Cabal M.D. [facility] Office: [phone]   05/15/2016, 5:31 PM

## 2016-05-15 NOTE — Patient Instructions (Signed)
Doxorubicin injection What is this medicine? DOXORUBICIN (dox oh ROO bi sin) is a chemotherapy drug. It is used to treat many kinds of cancer like leukemia, lymphoma, neuroblastoma, sarcoma, and Wilms' tumor. It is also used to treat bladder cancer, breast cancer, lung cancer, ovarian cancer, stomach cancer, and thyroid cancer. This medicine may be used for other purposes; ask your health care provider or pharmacist if you have questions. COMMON BRAND NAME(S): Adriamycin, Adriamycin PFS, Adriamycin RDF, Rubex What should I tell my health care provider before I take this medicine? They need to know if you have any of these conditions: -heart disease -history of low blood counts caused by a medicine -liver disease -recent or ongoing radiation therapy -an unusual or allergic reaction to doxorubicin, other chemotherapy agents, other medicines, foods, dyes, or preservatives -pregnant or trying to get pregnant -breast-feeding How should I use this medicine? This drug is given as an infusion into a vein. It is administered in a hospital or clinic by a specially trained health care professional. If you have pain, swelling, burning or any unusual feeling around the site of your injection, tell your health care professional right away. Talk to your pediatrician regarding the use of this medicine in children. Special care may be needed. Overdosage: If you think you have taken too much of this medicine contact a poison control center or emergency room at once. NOTE: This medicine is only for you. Do not share this medicine with others. What if I miss a dose? It is important not to miss your dose. Call your doctor or health care professional if you are unable to keep an appointment. What may interact with this medicine? This medicine may interact with the following medications: -6-mercaptopurine -paclitaxel -phenytoin -St. John's Wort -trastuzumab -verapamil This list may not describe all possible  interactions. Give your health care provider a list of all the medicines, herbs, non-prescription drugs, or dietary supplements you use. Also tell them if you smoke, drink alcohol, or use illegal drugs. Some items may interact with your medicine. What should I watch for while using this medicine? This drug may make you feel generally unwell. This is not uncommon, as chemotherapy can affect healthy cells as well as cancer cells. Report any side effects. Continue your course of treatment even though you feel ill unless your doctor tells you to stop. There is a maximum amount of this medicine you should receive throughout your life. The amount depends on the medical condition being treated and your overall health. Your doctor will watch how much of this medicine you receive in your lifetime. Tell your doctor if you have taken this medicine before. You may need blood work done while you are taking this medicine. Your urine may turn red for a few days after your dose. This is not blood. If your urine is dark or brown, call your doctor. In some cases, you may be given additional medicines to help with side effects. Follow all directions for their use. Call your doctor or health care professional for advice if you get a fever, chills or sore throat, or other symptoms of a cold or flu. Do not treat yourself. This drug decreases your body's ability to fight infections. Try to avoid being around people who are sick. This medicine may increase your risk to bruise or bleed. Call your doctor or health care professional if you notice any unusual bleeding. Talk to your doctor about your risk of cancer. You may be more at risk for certain   types of cancers if you take this medicine. Do not become pregnant while taking this medicine or for 6 months after stopping it. Women should inform their doctor if they wish to become pregnant or think they might be pregnant. Men should not father a child while taking this medicine and  for 6 months after stopping it. There is a potential for serious side effects to an unborn child. Talk to your health care professional or pharmacist for more information. Do not breast-feed an infant while taking this medicine. This medicine has caused ovarian failure in some women and reduced sperm counts in some men This medicine may interfere with the ability to have a child. Talk with your doctor or health care professional if you are concerned about your fertility. What side effects may I notice from receiving this medicine? Side effects that you should report to your doctor or health care professional as soon as possible: -allergic reactions like skin rash, itching or hives, swelling of the face, lips, or tongue -breathing problems -chest pain -fast or irregular heartbeat -low blood counts - this medicine may decrease the number of white blood cells, red blood cells and platelets. You may be at increased risk for infections and bleeding. -pain, redness, or irritation at site where injected -signs of infection - fever or chills, cough, sore throat, pain or difficulty passing urine -signs of decreased platelets or bleeding - bruising, pinpoint red spots on the skin, black, tarry stools, blood in the urine -swelling of the ankles, feet, hands -tiredness -weakness Side effects that usually do not require medical attention (report to your doctor or health care professional if they continue or are bothersome): -diarrhea -hair loss -mouth sores -nail discoloration or damage -nausea -red colored urine -vomiting This list may not describe all possible side effects. Call your doctor for medical advice about side effects. You may report side effects to FDA at 1-800-FDA-1088. Where should I keep my medicine? This drug is given in a hospital or clinic and will not be stored at home. NOTE: This sheet is a summary. It may not cover all possible information. If you have questions about this  medicine, talk to your doctor, pharmacist, or health care provider.  2017 [employer] Standard (2015-05-09 11:28:51) Bleomycin injection What is this medicine? BLEOMYCIN (blee oh MYE sin) is a chemotherapy drug. It is used to treat many kinds of cancer like lymphoma, cervical cancer, head and neck cancer, and testicular cancer. It is also used to prevent and to treat fluid build-up around the lungs caused by some cancers. This medicine may be used for other purposes; ask your health care provider or pharmacist if you have questions. COMMON BRAND NAME(S): Blenoxane What should I tell my health care provider before I take this medicine? They need to know if you have any of these conditions: -cigarette smoker -kidney disease -lung disease -recent or ongoing radiation therapy -an unusual or allergic reaction to bleomycin, other chemotherapy agents, other medicines, foods, dyes, or preservatives -pregnant or trying to get pregnant -breast-feeding How should I use this medicine? This drug is given as an infusion into a vein or a body cavity. It can also be given as an injection into a muscle or under the skin. It is administered in a hospital or clinic by a specially trained health care professional. Talk to your pediatrician regarding the use of this medicine in children. Special care may be needed. Overdosage: If you think you have taken too much of this medicine contact a  poison control center or emergency room at once. NOTE: This medicine is only for you. Do not share this medicine with others. What if I miss a dose? It is important not to miss your dose. Call your doctor or health care professional if you are unable to keep an appointment. What may interact with this medicine? -certain antibiotics given by injection -cisplatin -cyclosporine -diuretics -foscarnet -medicines to increase blood counts like filgrastim, pegfilgrastim, sargramostim -vaccines This list may not describe all  possible interactions. Give your health care provider a list of all the medicines, herbs, non-prescription drugs, or dietary supplements you use. Also tell them if you smoke, drink alcohol, or use illegal drugs. Some items may interact with your medicine. What should I watch for while using this medicine? Visit your doctor for checks on your progress. This drug may make you feel generally unwell. This is not uncommon, as chemotherapy can affect healthy cells as well as cancer cells. Report any side effects. Continue your course of treatment even though you feel ill unless your doctor tells you to stop. Call your doctor or health care professional for advice if you get a fever, chills or sore throat, or other symptoms of a cold or flu. Do not treat yourself. This drug decreases your body's ability to fight infections. Try to avoid being around people who are sick. Avoid taking products that contain aspirin, acetaminophen, ibuprofen, naproxen, or ketoprofen unless instructed by your doctor. These medicines may hide a fever. Do not become pregnant while taking this medicine. Women should inform their doctor if they wish to become pregnant or think they might be pregnant. There is a potential for serious side effects to an unborn child. Talk to your health care professional or pharmacist for more information. Do not breast-feed an infant while taking this medicine. There is a maximum amount of this medicine you should receive throughout your life. The amount depends on the medical condition being treated and your overall health. Your doctor will watch how much of this medicine you receive in your lifetime. Tell your doctor if you have taken this medicine before. What side effects may I notice from receiving this medicine? Side effects that you should report to your doctor or health care professional as soon as possible: -allergic reactions like skin rash, itching or hives, swelling of the face, lips, or  tongue -breathing problems -chest pain -confusion -cough -fast, irregular heartbeat -feeling faint or lightheaded, falls -fever or chills -mouth sores -pain, tingling, numbness in the hands or feet -trouble passing urine or change in the amount of urine -yellowing of the eyes or skin Side effects that usually do not require medical attention (report to your doctor or health care professional if they continue or are bothersome): -darker skin color -hair loss -irritation at site where injected -loss of appetite -nail changes -nausea and vomiting -weight loss This list may not describe all possible side effects. Call your doctor for medical advice about side effects. You may report side effects to FDA at 1-800-FDA-1088. Where should I keep my medicine? This drug is given in a hospital or clinic and will not be stored at home. NOTE: This sheet is a summary. It may not cover all possible information. If you have questions about this medicine, talk to your doctor, pharmacist, or health care provider.  2017 [employer]/Gold Standard (2012-07-08 09:36:48) Vinblastine injection What is this medicine? VINBLASTINE (vin BLAS teen) is a chemotherapy drug. It slows the growth of cancer cells. This medicine is used  to treat many types of cancer like breast cancer, testicular cancer, Hodgkin's disease, non-Hodgkin's lymphoma, and sarcoma. This medicine may be used for other purposes; ask your health care provider or pharmacist if you have questions. COMMON BRAND NAME(S): Velban What should I tell my health care provider before I take this medicine? They need to know if you have any of these conditions: -blood disorders -dental disease -gout -infection (especially a virus infection such as chickenpox, cold sores, or herpes) -liver disease -lung disease -nervous system disease -recent or ongoing radiation therapy -an unusual or allergic reaction to vinblastine, other chemotherapy agents, other  medicines, foods, dyes, or preservatives -pregnant or trying to get pregnant -breast-feeding How should I use this medicine? This drug is given as an infusion into a vein. It is administered in a hospital or clinic by a specially trained health care professional. If you have pain, swelling, burning or any unusual feeling around the site of your injection, tell your health care professional right away. Talk to your pediatrician regarding the use of this medicine in children. While this drug may be prescribed for selected conditions, precautions do apply. Overdosage: If you think you have taken too much of this medicine contact a poison control center or emergency room at once. NOTE: This medicine is only for you. Do not share this medicine with others. What if I miss a dose? It is important not to miss your dose. Call your doctor or health care professional if you are unable to keep an appointment. What may interact with this medicine? Do not take this medicine with any of the following medications: -erythromycin -itraconazole -mibefradil -voriconazole This medicine may also interact with the following medications: -cyclosporine -fluconazole -ketoconazole -medicines for seizures like phenytoin -medicines to increase blood counts like filgrastim, pegfilgrastim, sargramostim -vaccines -verapamil Talk to your doctor or health care professional before taking any of these medicines: -acetaminophen -aspirin -ibuprofen -ketoprofen -naproxen This list may not describe all possible interactions. Give your health care provider a list of all the medicines, herbs, non-prescription drugs, or dietary supplements you use. Also tell them if you smoke, drink alcohol, or use illegal drugs. Some items may interact with your medicine. What should I watch for while using this medicine? Your condition will be monitored carefully while you are receiving this medicine. You will need important blood work done  while you are taking this medicine. This drug may make you feel generally unwell. This is not uncommon, as chemotherapy can affect healthy cells as well as cancer cells. Report any side effects. Continue your course of treatment even though you feel ill unless your doctor tells you to stop. In some cases, you may be given additional medicines to help with side effects. Follow all directions for their use. Call your doctor or health care professional for advice if you get a fever, chills or sore throat, or other symptoms of a cold or flu. Do not treat yourself. This drug decreases your body's ability to fight infections. Try to avoid being around people who are sick. This medicine may increase your risk to bruise or bleed. Call your doctor or health care professional if you notice any unusual bleeding. Be careful brushing and flossing your teeth or using a toothpick because you may get an infection or bleed more easily. If you have any dental work done, tell your dentist you are receiving this medicine. Avoid taking products that contain aspirin, acetaminophen, ibuprofen, naproxen, or ketoprofen unless instructed by your doctor. These medicines may hide  a fever. Do not become pregnant while taking this medicine. Women should inform their doctor if they wish to become pregnant or think they might be pregnant. There is a potential for serious side effects to an unborn child. Talk to your health care professional or pharmacist for more information. Do not breast-feed an infant while taking this medicine. Men may have a lower sperm count while taking this medicine. Talk to your doctor if you plan to father a child. What side effects may I notice from receiving this medicine? Side effects that you should report to your doctor or health care professional as soon as possible: -allergic reactions like skin rash, itching or hives, swelling of the face, lips, or tongue -low blood counts - This drug may decrease the  number of white blood cells, red blood cells and platelets. You may be at increased risk for infections and bleeding. -signs of infection - fever or chills, cough, sore throat, pain or difficulty passing urine -signs of decreased platelets or bleeding - bruising, pinpoint red spots on the skin, black, tarry stools, nosebleeds -signs of decreased red blood cells - unusually weak or tired, fainting spells, lightheadedness -breathing problems -changes in hearing -change in the amount of urine -chest pain -high blood pressure -mouth sores -nausea and vomiting -pain, swelling, redness or irritation at the injection site -pain, tingling, numbness in the hands or feet -problems with balance, dizziness -seizures Side effects that usually do not require medical attention (report to your doctor or health care professional if they continue or are bothersome): -constipation -hair loss -jaw pain -loss of appetite -sensitivity to light -stomach pain -tumor pain This list may not describe all possible side effects. Call your doctor for medical advice about side effects. You may report side effects to FDA at 1-800-FDA-1088. Where should I keep my medicine? This drug is given in a hospital or clinic and will not be stored at home. NOTE: This sheet is a summary. It may not cover all possible information. If you have questions about this medicine, talk to your doctor, pharmacist, or health care provider.  2017 [employer]/Gold Standard (2007-12-08 17:15:59) Dacarbazine, DTIC injection What is this medicine? DACARBAZINE (da KAR ba zeen) is a chemotherapy drug. This medicine is used to treat skin cancer. It is also used with other medicines to treat Hodgkin's disease. This medicine may be used for other purposes; ask your health care provider or pharmacist if you have questions. COMMON BRAND NAME(S): DTIC-Dome What should I tell my health care provider before I take this medicine? They need to know if you  have any of these conditions: -infection (especially virus infection such as chickenpox, cold sores, or herpes) -kidney disease -liver disease -low blood counts like low platelets, red blood cells, white blood cells -recent radiation therapy -an unusual or allergic reaction to dacarbazine, other chemotherapy agents, other medicines, foods, dyes, or preservatives -pregnant or trying to get pregnant -breast-feeding How should I use this medicine? This drug is given as an injection or infusion into a vein. It is administered in a hospital or clinic by a specially trained health care professional. Talk to your pediatrician regarding the use of this medicine in children. While this drug may be prescribed for selected conditions, precautions do apply. Overdosage: If you think you have taken too much of this medicine contact a poison control center or emergency room at once. NOTE: This medicine is only for you. Do not share this medicine with others. What if I miss a dose?  It is important not to miss your dose. Call your doctor or health care professional if you are unable to keep an appointment. What may interact with this medicine? -medicines to increase blood counts like filgrastim, pegfilgrastim, sargramostim -vaccines This list may not describe all possible interactions. Give your health care provider a list of all the medicines, herbs, non-prescription drugs, or dietary supplements you use. Also tell them if you smoke, drink alcohol, or use illegal drugs. Some items may interact with your medicine. What should I watch for while using this medicine? Your condition will be monitored carefully while you are receiving this medicine. You will need important blood work done while you are taking this medicine. This drug may make you feel generally unwell. This is not uncommon, as chemotherapy can affect healthy cells as well as cancer cells. Report any side effects. Continue your course of treatment  even though you feel ill unless your doctor tells you to stop. Call your doctor or health care professional for advice if you get a fever, chills or sore throat, or other symptoms of a cold or flu. Do not treat yourself. This drug decreases your body's ability to fight infections. Try to avoid being around people who are sick. This medicine may increase your risk to bruise or bleed. Call your doctor or health care professional if you notice any unusual bleeding. Talk to your doctor about your risk of cancer. You may be more at risk for certain types of cancers if you take this medicine. Do not become pregnant while taking this medicine. Women should inform their doctor if they wish to become pregnant or think they might be pregnant. There is a potential for serious side effects to an unborn child. Talk to your health care professional or pharmacist for more information. Do not breast-feed an infant while taking this medicine. What side effects may I notice from receiving this medicine? Side effects that you should report to your doctor or health care professional as soon as possible: -allergic reactions like skin rash, itching or hives, swelling of the face, lips, or tongue -low blood counts - this medicine may decrease the number of white blood cells, red blood cells and platelets. You may be at increased risk for infections and bleeding. -signs of infection - fever or chills, cough, sore throat, pain or difficulty passing urine -signs of decreased platelets or bleeding - bruising, pinpoint red spots on the skin, black, tarry stools, blood in the urine -signs of decreased red blood cells - unusually weak or tired, fainting spells, lightheadedness -breathing problems -muscle pains -pain at site where injected -trouble passing urine or change in the amount of urine -vomiting -yellowing of the eyes or skin Side effects that usually do not require medical attention (report to your doctor or health  care professional if they continue or are bothersome): -diarrhea -hair loss -loss of appetite -nausea -skin more sensitive to sun or ultraviolet light -stomach upset This list may not describe all possible side effects. Call your doctor for medical advice about side effects. You may report side effects to FDA at 1-800-FDA-1088. Where should I keep my medicine? This drug is given in a hospital or clinic and will not be stored at home. NOTE: This sheet is a summary. It may not cover all possible information. If you have questions about this medicine, talk to your doctor, pharmacist, or health care provider.  2017 [employer] Standard (2015-05-13 15:17:39) Pegfilgrastim injection What is this medicine? PEGFILGRASTIM (PEG fil gra stim)  is a long-acting granulocyte colony-stimulating factor that stimulates the growth of neutrophils, a type of white blood cell important in the body's fight against infection. It is used to reduce the incidence of fever and infection in patients with certain types of cancer who are receiving chemotherapy that affects the bone marrow, and to increase survival after being exposed to high doses of radiation. This medicine may be used for other purposes; ask your health care provider or pharmacist if you have questions. COMMON BRAND NAME(S): Neulasta What should I tell my health care provider before I take this medicine? They need to know if you have any of these conditions: -kidney disease -latex allergy -ongoing radiation therapy -sickle cell disease -skin reactions to acrylic adhesives (On-Body Injector only) -an unusual or allergic reaction to pegfilgrastim, filgrastim, other medicines, foods, dyes, or preservatives -pregnant or trying to get pregnant -breast-feeding How should I use this medicine? This medicine is for injection under the skin. If you get this medicine at home, you will be taught how to prepare and give the pre-filled syringe or how to use the  On-body Injector. Refer to the patient Instructions for Use for detailed instructions. Use exactly as directed. Take your medicine at regular intervals. Do not take your medicine more often than directed. It is important that you put your used needles and syringes in a special sharps container. Do not put them in a trash can. If you do not have a sharps container, call your pharmacist or healthcare provider to get one. Talk to your pediatrician regarding the use of this medicine in children. While this drug may be prescribed for selected conditions, precautions do apply. Overdosage: If you think you have taken too much of this medicine contact a poison control center or emergency room at once. NOTE: This medicine is only for you. Do not share this medicine with others. What if I miss a dose? It is important not to miss your dose. Call your doctor or health care professional if you miss your dose. If you miss a dose due to an On-body Injector failure or leakage, a new dose should be administered as soon as possible using a single prefilled syringe for manual use. What may interact with this medicine? Interactions have not been studied. Give your health care provider a list of all the medicines, herbs, non-prescription drugs, or dietary supplements you use. Also tell them if you smoke, drink alcohol, or use illegal drugs. Some items may interact with your medicine. This list may not describe all possible interactions. Give your health care provider a list of all the medicines, herbs, non-prescription drugs, or dietary supplements you use. Also tell them if you smoke, drink alcohol, or use illegal drugs. Some items may interact with your medicine. What should I watch for while using this medicine? You may need blood work done while you are taking this medicine. If you are going to need a MRI, CT scan, or other procedure, tell your doctor that you are using this medicine (On-Body Injector only). What side  effects may I notice from receiving this medicine? Side effects that you should report to your doctor or health care professional as soon as possible: -allergic reactions like skin rash, itching or hives, swelling of the face, lips, or tongue -dizziness -fever -pain, redness, or irritation at site where injected -pinpoint red spots on the skin -red or dark-brown urine -shortness of breath or breathing problems -stomach or side pain, or pain at the shoulder -swelling -tiredness -trouble  passing urine or change in the amount of urine Side effects that usually do not require medical attention (report to your doctor or health care professional if they continue or are bothersome): -bone pain -muscle pain This list may not describe all possible side effects. Call your doctor for medical advice about side effects. You may report side effects to FDA at 1-800-FDA-1088. Where should I keep my medicine? Keep out of the reach of children. Store pre-filled syringes in a refrigerator between 2 and 8 degrees C (36 and 46 degrees F). Do not freeze. Keep in carton to protect from light. Throw away this medicine if it is left out of the refrigerator for more than 48 hours. Throw away any unused medicine after the expiration date. NOTE: This sheet is a summary. It may not cover all possible information. If you have questions about this medicine, talk to your doctor, pharmacist, or health care provider.  2017 Elsevier/Gold Standard (2014-04-01 14:30:14)

## 2016-05-16 NOTE — Progress Notes (Signed)
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 He continues to have increased swelling of his right neck. He continues to be anxious. He otherwise has felt well. He denies any fevers, night sweats, or weight loss. He has no neurologic complaints. He denies any dysphagia or difficulty swallowing. He has no chest pain or shortness of breath. He denies any nausea, vomiting, constipation, or diarrhea. He has no urinary complaints. Patient otherwise feels well and offers no further specific complaints.  REVIEW OF SYSTEMS:   Review of Systems  Constitutional: Negative.  Negative for fever, malaise/fatigue and weight loss.  HENT: Negative.   Respiratory: Negative.  Negative for cough and shortness of breath.   Cardiovascular: Negative.  Negative for chest pain and leg swelling.  Gastrointestinal: Negative.  Negative for abdominal pain.  Genitourinary: Negative.   Musculoskeletal: Positive for neck pain.  Neurological: Negative.  Negative for weakness.  Psychiatric/Behavioral: The patient is nervous/anxious.     As per HPI. Otherwise, a complete review of systems is negative.  PAST MEDICAL HISTORY: Past Medical History:  Diagnosis Date  . Medical history non-contributory     PAST SURGICAL HISTORY: Past Surgical History:  Procedure Laterality Date  . LYMPH NODE BIOPSY Right 04/13/2016   Procedure: biopsy of lymph nodes open deep cervical node (right);  Surgeon: Margaretha Sheffield, MD;  Location: [place];  Service: ENT;  Laterality: Right;  . NO PAST SURGERIES    . PORTA CATH INSERTION N/A 05/15/2016   Procedure: Glori Luis Cath Insertion;  Surgeon: Katha Cabal, MD;  Location: [facility];  Service: Cardiovascular;  Laterality: N/A;    FAMILY HISTORY: Family History  Problem Relation Age of Onset  . Heart disease Father   . COPD Father   . Hypertension Father   . Hypertension Brother   . Alzheimer's disease Maternal Grandmother   . Pancreatic cancer Maternal Grandfather     ADVANCED DIRECTIVES (Y/N):  N  HEALTH MAINTENANCE: Social History  Substance Use Topics  . Smoking status: Former Research scientist (life sciences)  . Smokeless tobacco: Former Systems developer    Types: Snuff    Quit date: 05/25/2015     Comment: social in college  . Alcohol use 1.2 oz/week    2 Glasses of wine per week     Colonoscopy:  PAP:  Bone density:  Lipid panel:  No Known Allergies  Current Outpatient Prescriptions  Medication Sig Dispense Refill  . acetaminophen (TYLENOL) 325 MG tablet Take 650 mg by mouth every 6 (six) hours as needed for moderate pain or headache.    . lidocaine-prilocaine (EMLA) cream Apply to affected area once (Patient taking differently: Apply to affected area once) 30 g 3  . ondansetron (ZOFRAN) 8 MG tablet Take 1 tablet (8 mg total) by mouth 2 (two) times daily as needed. (Patient taking differently: Take 8 mg by mouth 2 (two) times daily as needed. Pick up at pharmacy) 30 tablet 1  . prochlorperazine (COMPAZINE) 10 MG tablet Take 1 tablet (10 mg total) by mouth every 6 (six) hours as needed (Nausea or vomiting). (Patient taking differently: Take 10 mg by mouth every 6 (six) hours as needed (Nausea  or vomiting). ) 30 tablet 1   Current Facility-Administered Medications  Medication Dose Route Frequency Provider Last Rate Last Dose  . ceFAZolin (ANCEF) IVPB 1 g/50 mL premix  1 g Intravenous Once Katha Cabal, MD        OBJECTIVE: Vitals:   05/17/16 1414  BP: (!) 184/83  Pulse: (!) 132  Temp: 99.5 F (37.5 C)     Body mass index is 32.9 kg/m.    ECOG FS:0 - Asymptomatic  General:  Well-developed, well-nourished, no acute distress. Eyes: Pink conjunctiva, anicteric sclera. HEENT: Large easily palpable right neck mass consistent with lymphadenopathy. Lungs: Clear to auscultation bilaterally. Heart: Regular rate and rhythm. No rubs, murmurs, or gallops. Abdomen: Soft, nontender, nondistended. No organomegaly noted, normoactive bowel sounds. Musculoskeletal: No edema, cyanosis, or clubbing. Neuro: Alert, answering all questions appropriately. Cranial nerves grossly intact. Skin: No rashes or petechiae noted. Psych: Normal affect. Lymphatics: No cervical, calvicular, axillary or inguinal LAD.   LAB RESULTS:  Lab Results  Component Value Date   NA 135 05/17/2016   K 3.6 05/17/2016   CL 99 (L) 05/17/2016   CO2 25 05/17/2016   GLUCOSE 160 (H) 05/17/2016   BUN 14 05/17/2016   CREATININE 0.91 05/17/2016   CALCIUM 9.2 05/17/2016   PROT 8.8 (H) 05/17/2016   ALBUMIN 3.7 05/17/2016   AST 24 05/17/2016   ALT 17 05/17/2016   ALKPHOS 145 (H) 05/17/2016   BILITOT 0.4 05/17/2016   GFRNONAA >60 05/17/2016   GFRAA >60 05/17/2016    Lab Results  Component Value Date   WBC 7.9 05/17/2016   NEUTROABS 6.2 05/17/2016   HGB 11.6 (L) 05/17/2016   HCT 34.9 (L) 05/17/2016   MCV 72.7 (L) 05/17/2016   PLT 426 05/17/2016     STUDIES: Nm Cardiac Muga Rest  Result Date: 05/01/2016 CLINICAL DATA:  Hodgkin's lymphoma, high risk chemotherapy EXAM: NUCLEAR MEDICINE CARDIAC BLOOD POOL IMAGING (MUGA) TECHNIQUE: Cardiac multi-gated acquisition was performed at rest following intravenous injection of Tc-49mlabeled red blood cells. RADIOPHARMACEUTICALS:  19.38 mCi Tc-919mertechnetate in-vitro labeled red blood cells IV COMPARISON:  None FINDINGS: Calculated LEFT ventricular ejection fraction is 67%, within the normal range. Study was obtained at a cardiac rate 88 bpm. Normal LEFT ventricular wall motion identified on cine analysis. Patient was rhythmic during imaging. IMPRESSION: Normal  LEFT ventricular ejection fraction of 67%. Normal LV wall motion. Electronically Signed   By: MaLavonia Dana.D.   On: 05/01/2016 15:02   Nm Pet Image Initial (pi) Skull Base To Thigh  Result Date: 04/25/2016 CLINICAL DATA:  Initial treatment strategy for nodular sclerosing Hodgkin lymphoma diagnosed on recent right neck lymph node excisional biopsy. EXAM: NUCLEAR MEDICINE PET SKULL BASE TO THIGH TECHNIQUE: 12.5 mCi F-18 FDG was injected intravenously. Full-ring PET imaging was performed from the skull base to thigh after the radiotracer. CT data was obtained and used for attenuation correction and anatomic localization. FASTING BLOOD GLUCOSE:  Value: 95 mg/dl COMPARISON:  04/10/2016 neck and chest CT. FINDINGS: NECK There is bulky hypermetabolic confluent right neck lymphadenopathy involving levels 2 through 5, with representative right neck nodes as follows: - right level 2 neck 4.0 cm node with max SUV 18.6 (series 4/ image 42) - right level 3 neck 2.2 cm node with max SUV 11.7 (series 4/ image 51) - right level 4 neck 1.9 cm node with max SUV 16.2 (series 4/ image 62) - right level 5 neck 1.6 cm node with max SUV 9.4 (series 4/ image  51) There is a hypermetabolic minimally prominent 0.8 cm right suboccipital node with max SUV 3.8 (series 4/ image 22). There is a mildly hypermetabolic minimally prominent 0.6 cm left level 4 neck lymph node with max SUV 3.0 (series 4/image 64). No additional hypermetabolic left neck lymph nodes. CHEST No pleural or pericardial effusions. No hypermetabolic axillary or hilar lymphadenopathy. There is hypermetabolic subcarinal lymphadenopathy measuring up to 1.2 cm with max SUV 5.2 (series 4/ image 89). No additional hypermetabolic mediastinal nodes. Subpleural 4 mm right pulmonary nodule associated with the minor fissure is below PET resolution (series 4/ image 95). No acute consolidative airspace disease, lung masses or additional significant pulmonary nodules. No pneumothorax.  There is focal hypermetabolism at the left lateral T12 vertebral margin with max SUV 12.1, without discrete CT correlate. ABDOMEN/PELVIS Nonenlarged hypermetabolic right retrocrural nodes measuring up to 0.6 cm with max SUV 5.1 (series 4/image 145). Nonenlarged hypermetabolic left para-aortic nodes measuring up to 0.6 cm with max SUV 4.0 (series 4/image 184). Nonenlarged 0.6 cm hypermetabolic left common iliac node with max SUV 3.3 (series 4/image 212). No additional hypermetabolic or enlarged abdominopelvic nodes. No abnormal hypermetabolic activity within the liver, pancreas, or spleen. Normal size spleen. Symmetric mild adrenal hypermetabolism without discrete adrenal nodules, favoring mild adrenal hyperplasia. Simple 1.7 cm anterior upper right renal cyst. Nonspecific internal prostatic calcifications. Nonspecific hypermetabolism at the anorectal junction without CT correlate. SKELETON No focal hypermetabolic activity to suggest skeletal metastasis. IMPRESSION: 1. Hypermetabolic neck and thoracic lymphadenopathy, including bulky confluent hypermetabolic right neck lymphadenopathy predominantly involving nodal levels 2 through 5 with additional small hypermetabolic right suboccipital lymph node, solitary small hypermetabolic left neck lymph node at nodal level 4, and hypermetabolic subcarinal lymphadenopathy. 2. Nonenlarged hypermetabolic right retrocrural, retroperitoneal and left pelvic lymph nodes and hypermetabolism at the left lateral T12 vertebral margin, suspicious for additional sites of lymphoma. 3. Normal size spleen.  No splenic hypermetabolism. 4. Subpleural 4 mm right pulmonary nodule associated with the minor fissure, below PET resolution, probably benign. 5. Nonspecific hypermetabolism at the anorectal junction without CT correlate, potentially physiologic, recommend correlation with directed clinical history and exam. Electronically Signed   By: Ilona Sorrel M.D.   On: 04/25/2016 12:25   Ct  Biopsy  Result Date: 05/03/2016 CLINICAL DATA:  New diagnosis of nodular sclerosing classical Hodgkin's lymphoma. Bone marrow biopsy required for staging purposes. EXAM: CT GUIDED BONE MARROW ASPIRATION AND BIOPSY ANESTHESIA/SEDATION: Versed 2.0 mg IV, Fentanyl 100 mcg IV Total Moderate Sedation Time:  18 minutes. The patient's level of consciousness and physiologic status were continuously monitored during the procedure by Radiology nursing. PROCEDURE: The procedure risks, benefits, and alternatives were explained to the patient. Questions regarding the procedure were encouraged and answered. The patient understands and consents to the procedure. A time out was performed prior to initiating the procedure. The right gluteal region was prepped with chlorhexidine. Sterile gown and sterile gloves were used for the procedure. Local anesthesia was provided with 1% Lidocaine. Under CT guidance, an 11 gauge On Control bone cutting needle was advanced from a posterior approach into the right iliac bone. Needle positioning was confirmed with CT. Initial non heparinized and heparinized aspirate samples were obtained of bone marrow. Core biopsy was performed via the On Control drill needle. COMPLICATIONS: None FINDINGS: Inspection of initial aspirate did reveal visible particles. Intact core biopsy sample was obtained. IMPRESSION: CT guided bone marrow biopsy of right posterior iliac bone with both aspirate and core samples obtained. Electronically Signed   By: Eulas Post  Kathlene Cote M.D.   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 Bone marrow biopsy is negative. MUGA scan and PFTs are adequate to proceed with treatment. Proceed with cycle 1, day 1 of ABVD tomorrow. He will also likely  require OnPro Neulasta support, but will not initiate at this time. Patient will receive treatment on days 1 and 15 for a total of 6 cycles. Return to clinic in 1 week for laboratory work and to assess his toleration of cycle 1 and then in 2 weeks for consideration of cycle 1, day 15.  Approximately 30 minutes was spent in discussion of which greater than 50% was consultation.  Patient expressed understanding and was in agreement with this plan. He also understands that He can call clinic at any time with any questions, concerns, or complaints.   Cancer Staging Nodular sclerosis Hodgkin lymphoma of lymph nodes of neck (HCC) Staging form: Hodgkin and Non-Hodgkin Lymphoma, AJCC 8th Edition - Clinical stage from 05/09/2016: Stage III (Hodgkin lymphoma, A - Asymptomatic) - Signed by Lloyd Huger, MD on 05/09/2016   Lloyd Huger, MD   05/21/2016 9:24 AM

## 2016-05-17 ENCOUNTER — Inpatient Hospital Stay: Payer: BLUE CROSS/BLUE SHIELD

## 2016-05-17 ENCOUNTER — Inpatient Hospital Stay (HOSPITAL_BASED_OUTPATIENT_CLINIC_OR_DEPARTMENT_OTHER): Payer: BLUE CROSS/BLUE SHIELD | Admitting: Oncology

## 2016-05-17 VITALS — BP 184/83 | HR 132 | Temp 99.5°F | Wt 216.4 lb

## 2016-05-17 DIAGNOSIS — F419 Anxiety disorder, unspecified: Secondary | ICD-10-CM

## 2016-05-17 DIAGNOSIS — Z87891 Personal history of nicotine dependence: Secondary | ICD-10-CM | POA: Diagnosis not present

## 2016-05-17 DIAGNOSIS — C8111 Nodular sclerosis classical Hodgkin lymphoma, lymph nodes of head, face, and neck: Secondary | ICD-10-CM

## 2016-05-17 DIAGNOSIS — Z79899 Other long term (current) drug therapy: Secondary | ICD-10-CM | POA: Diagnosis not present

## 2016-05-17 DIAGNOSIS — R590 Localized enlarged lymph nodes: Secondary | ICD-10-CM | POA: Diagnosis not present

## 2016-05-17 DIAGNOSIS — R911 Solitary pulmonary nodule: Secondary | ICD-10-CM | POA: Diagnosis not present

## 2016-05-17 DIAGNOSIS — Z800 Family history of malignant neoplasm of digestive organs: Secondary | ICD-10-CM | POA: Diagnosis not present

## 2016-05-17 LAB — COMPREHENSIVE METABOLIC PANEL
ALBUMIN: 3.7 g/dL (ref 3.5–5.0)
ALT: 17 U/L (ref 17–63)
ANION GAP: 11 (ref 5–15)
AST: 24 U/L (ref 15–41)
Alkaline Phosphatase: 145 U/L — ABNORMAL HIGH (ref 38–126)
BUN: 14 mg/dL (ref 6–20)
CHLORIDE: 99 mmol/L — AB (ref 101–111)
CO2: 25 mmol/L (ref 22–32)
Calcium: 9.2 mg/dL (ref 8.9–10.3)
Creatinine, Ser: 0.91 mg/dL (ref 0.61–1.24)
GFR calc Af Amer: >60 mL/min (ref >60)
GFR calc non Af Amer: >60 mL/min (ref >60)
GLUCOSE: 160 mg/dL — AB (ref 65–99)
POTASSIUM: 3.6 mmol/L (ref 3.5–5.1)
Sodium: 135 mmol/L (ref 135–145)
Total Bilirubin: 0.4 mg/dL (ref 0.3–1.2)
Total Protein: 8.8 g/dL — ABNORMAL HIGH (ref 6.5–8.1)

## 2016-05-17 LAB — CBC WITH DIFFERENTIAL/PLATELET
BASOS PCT: 1 %
Basophils Absolute: 0.1 10*3/uL (ref 0–0.1)
EOS ABS: 0.1 10*3/uL (ref 0–0.7)
Eosinophils Relative: 1 %
HCT: 34.9 % — ABNORMAL LOW (ref 40.0–52.0)
Hemoglobin: 11.6 g/dL — ABNORMAL LOW (ref 13.0–18.0)
Lymphocytes Relative: 13 %
Lymphs Abs: 1 10*3/uL (ref 1.0–3.6)
MCH: 24.2 pg — ABNORMAL LOW (ref 26.0–34.0)
MCHC: 33.2 g/dL (ref 32.0–36.0)
MCV: 72.7 fL — ABNORMAL LOW (ref 80.0–100.0)
MONOS PCT: 7 %
Monocytes Absolute: 0.5 10*3/uL (ref 0.2–1.0)
NEUTROS PCT: 78 %
Neutro Abs: 6.2 10*3/uL (ref 1.4–6.5)
Platelets: 426 10*3/uL (ref 150–440)
RBC: 4.79 MIL/uL (ref 4.40–5.90)
RDW: 17 % — AB (ref 11.5–14.5)
WBC: 7.9 10*3/uL (ref 3.8–10.6)

## 2016-05-17 NOTE — Progress Notes (Signed)
Patient here today for follow up.   

## 2016-05-18 ENCOUNTER — Encounter: Payer: Self-pay | Admitting: Vascular Surgery

## 2016-05-18 ENCOUNTER — Inpatient Hospital Stay: Payer: BLUE CROSS/BLUE SHIELD

## 2016-05-18 VITALS — BP 141/88 | HR 106 | Temp 97.5°F | Resp 20

## 2016-05-18 DIAGNOSIS — C8111 Nodular sclerosis classical Hodgkin lymphoma, lymph nodes of head, face, and neck: Secondary | ICD-10-CM

## 2016-05-18 MED ORDER — HEPARIN SOD (PORK) LOCK FLUSH 100 UNIT/ML IV SOLN
500.0000 [IU] | Freq: Once | INTRAVENOUS | Status: AC | PRN
  Administered 2016-05-18: 500 [IU]
  Filled 2016-05-18: qty 5

## 2016-05-18 MED ORDER — PALONOSETRON HCL INJECTION 0.25 MG/5ML
0.2500 mg | Freq: Once | INTRAVENOUS | Status: AC
  Administered 2016-05-18: 0.25 mg via INTRAVENOUS

## 2016-05-18 MED ORDER — VINBLASTINE SULFATE CHEMO INJECTION 1 MG/ML: 6.0000 mg/m2 | Freq: Once | INTRAVENOUS | Status: DC

## 2016-05-18 MED ORDER — FOSAPREPITANT DIMEGLUMINE INJECTION 150 MG
Freq: Once | INTRAVENOUS | Status: AC
  Administered 2016-05-18: 10:00:00 via INTRAVENOUS
  Filled 2016-05-18: qty 5

## 2016-05-18 MED ORDER — SODIUM CHLORIDE 0.9 % IV SOLN
10.0000 [IU]/m2 | Freq: Once | INTRAVENOUS | Status: AC
  Administered 2016-05-18: 21 [IU] via INTRAVENOUS
  Filled 2016-05-18: qty 7

## 2016-05-18 MED ORDER — SODIUM CHLORIDE 0.9% FLUSH
10.0000 mL | INTRAVENOUS | Status: DC | PRN
  Administered 2016-05-18: 10 mL
  Filled 2016-05-18: qty 10

## 2016-05-18 MED ORDER — SODIUM CHLORIDE 0.9 % IV SOLN
375.0000 mg/m2 | Freq: Once | INTRAVENOUS | Status: AC
  Administered 2016-05-18: 800 mg via INTRAVENOUS
  Filled 2016-05-18: qty 40

## 2016-05-18 MED ORDER — VINBLASTINE SULFATE CHEMO INJECTION 1 MG/ML
6.0000 mg/m2 | Freq: Once | INTRAVENOUS | Status: AC
  Administered 2016-05-18: 13 mg via INTRAVENOUS
  Filled 2016-05-18: qty 13

## 2016-05-18 MED ORDER — DOXORUBICIN HCL CHEMO IV INJECTION 2 MG/ML
25.0000 mg/m2 | Freq: Once | INTRAVENOUS | Status: AC
  Administered 2016-05-18: 54 mg via INTRAVENOUS
  Filled 2016-05-18: qty 27

## 2016-05-18 MED ORDER — SODIUM CHLORIDE 0.9 % IV SOLN
Freq: Once | INTRAVENOUS | Status: AC
  Administered 2016-05-18: 09:00:00 via INTRAVENOUS
  Filled 2016-05-18: qty 1000

## 2016-05-23 NOTE — Progress Notes (Signed)
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 Swelling in his neck has significantly decreased. He continues to be anxious, but this is improved as well. He tolerated his treatment well without significant side effects. He denies any fevers, night sweats, or weight loss. He has no neurologic complaints. He denies any dysphagia or difficulty swallowing. He has no chest pain or shortness of breath. He denies any nausea, vomiting, constipation, or diarrhea. He has no urinary complaints. Patient otherwise feels well and offers no further specific complaints.  REVIEW OF SYSTEMS:   Review of Systems  Constitutional: Negative.  Negative for fever, malaise/fatigue and weight loss.  HENT: Negative.   Respiratory: Negative.  Negative for cough and shortness of breath.   Cardiovascular: Negative.  Negative for chest pain and leg swelling.  Gastrointestinal: Negative.  Negative for abdominal pain.  Genitourinary: Negative.   Musculoskeletal: Positive for neck pain.  Neurological: Negative.  Negative for weakness.  Psychiatric/Behavioral: The patient is nervous/anxious.     As per HPI. Otherwise, a complete review of systems is negative.  PAST MEDICAL HISTORY: Past Medical History:  Diagnosis Date  . Medical history non-contributory     PAST SURGICAL HISTORY: Past Surgical History:  Procedure Laterality Date  . LYMPH NODE BIOPSY Right 04/13/2016   Procedure: biopsy of lymph nodes open deep cervical node (right);  Surgeon: Margaretha Sheffield, MD;  Location: [facility];  Service: ENT;  Laterality: Right;  .  NO PAST SURGERIES    . PORTA CATH INSERTION N/A 05/15/2016   Procedure: Glori Luis Cath Insertion;  Surgeon: Katha Cabal, MD;  Location: [facility];  Service: Cardiovascular;  Laterality: N/A;    FAMILY HISTORY: Family History  Problem Relation Age of Onset  . Heart disease Father   . COPD Father   . Hypertension Father   . Hypertension Brother   . Alzheimer's disease Maternal Grandmother   . Pancreatic cancer Maternal Grandfather     ADVANCED DIRECTIVES (Y/N):  N  HEALTH MAINTENANCE: Social History  Substance Use Topics  . Smoking status: Former Research scientist (life sciences)  . Smokeless tobacco: Former Systems developer    Types: Snuff    Quit date: 05/25/2015     Comment: social in college  . Alcohol use 1.2 oz/week    2 Glasses of wine per week     Colonoscopy:  PAP:  Bone density:  Lipid panel:  No Known Allergies  Current Outpatient Prescriptions  Medication Sig Dispense Refill  . acetaminophen (TYLENOL) 325 MG tablet Take 650 mg by mouth every 6 (six) hours as needed for moderate pain or headache.    . lidocaine-prilocaine (EMLA) cream Apply to affected area once (Patient taking differently: Apply to affected area once) 30 g 3  . ondansetron (ZOFRAN) 8 MG tablet Take 1 tablet (8 mg total) by mouth 2 (two) times daily as needed. (Patient not taking: Reported on 05/24/2016) 30 tablet 1  . prochlorperazine (COMPAZINE) 10 MG tablet Take 1 tablet (10 mg total) by mouth every 6 (six) hours as needed (Nausea or vomiting). (Patient not taking: Reported on 05/24/2016) 30 tablet 1   Current Facility-Administered Medications  Medication Dose  Route Frequency Provider Last Rate Last Dose  . ceFAZolin (ANCEF) IVPB 1 g/50 mL premix  1 g Intravenous Once Katha Cabal, MD        OBJECTIVE: Vitals:   05/24/16 1528  BP: (!) 156/95  Pulse: (!) 120  Resp: 18  Temp: 97.6 F (36.4 C)     Body mass index is 33.15 kg/m.    ECOG FS:0 - Asymptomatic  General: Well-developed, well-nourished, no acute  distress. Eyes: Pink conjunctiva, anicteric sclera. HEENT: Large easily palpable right neck mass, significantly decreased in size. Lungs: Clear to auscultation bilaterally. Heart: Regular rate and rhythm. No rubs, murmurs, or gallops. Abdomen: Soft, nontender, nondistended. No organomegaly noted, normoactive bowel sounds. Musculoskeletal: No edema, cyanosis, or clubbing. Neuro: Alert, answering all questions appropriately. Cranial nerves grossly intact. Skin: No rashes or petechiae noted. Psych: Normal affect. Lymphatics: No cervical, calvicular, axillary or inguinal LAD.   LAB RESULTS:  Lab Results  Component Value Date   NA 135 05/24/2016   K 3.7 05/24/2016   CL 101 05/24/2016   CO2 25 05/24/2016   GLUCOSE 128 (H) 05/24/2016   BUN 17 05/24/2016   CREATININE 0.92 05/24/2016   CALCIUM 8.8 (L) 05/24/2016   PROT 8.2 (H) 05/24/2016   ALBUMIN 4.1 05/24/2016   AST 29 05/24/2016   ALT 36 05/24/2016   ALKPHOS 135 (H) 05/24/2016   BILITOT 0.3 05/24/2016   GFRNONAA >60 05/24/2016   GFRAA >60 05/24/2016    Lab Results  Component Value Date   WBC 2.7 (L) 05/24/2016   NEUTROABS 1.5 05/24/2016   HGB 12.3 (L) 05/24/2016   HCT 36.2 (L) 05/24/2016   MCV 71.6 (L) 05/24/2016   PLT 320 05/24/2016     STUDIES: Nm Cardiac Muga Rest  Result Date: 05/01/2016 CLINICAL DATA:  Hodgkin's lymphoma, high risk chemotherapy EXAM: NUCLEAR MEDICINE CARDIAC BLOOD POOL IMAGING (MUGA) TECHNIQUE: Cardiac multi-gated acquisition was performed at rest following intravenous injection of Tc-65mlabeled red blood cells. RADIOPHARMACEUTICALS:  19.38 mCi Tc-944mertechnetate in-vitro labeled red blood cells IV COMPARISON:  None FINDINGS: Calculated LEFT ventricular ejection fraction is 67%, within the normal range. Study was obtained at a cardiac rate 88 bpm. Normal LEFT ventricular wall motion identified on cine analysis. Patient was rhythmic during imaging. IMPRESSION: Normal LEFT ventricular ejection fraction  of 67%. Normal LV wall motion. Electronically Signed   By: MaLavonia Dana.D.   On: 05/01/2016 15:02   Ct Biopsy  Result Date: 05/03/2016 CLINICAL DATA:  New diagnosis of nodular sclerosing classical Hodgkin's lymphoma. Bone marrow biopsy required for staging purposes. EXAM: CT GUIDED BONE MARROW ASPIRATION AND BIOPSY ANESTHESIA/SEDATION: Versed 2.0 mg IV, Fentanyl 100 mcg IV Total Moderate Sedation Time:  18 minutes. The patient's level of consciousness and physiologic status were continuously monitored during the procedure by Radiology nursing. PROCEDURE: The procedure risks, benefits, and alternatives were explained to the patient. Questions regarding the procedure were encouraged and answered. The patient understands and consents to the procedure. A time out was performed prior to initiating the procedure. The right gluteal region was prepped with chlorhexidine. Sterile gown and sterile gloves were used for the procedure. Local anesthesia was provided with 1% Lidocaine. Under CT guidance, an 11 gauge On Control bone cutting needle was advanced from a posterior approach into the right iliac bone. Needle positioning was confirmed with CT. Initial non heparinized and heparinized aspirate samples were obtained of bone marrow. Core biopsy was performed via the On Control drill needle. COMPLICATIONS: None FINDINGS: Inspection of  initial aspirate did reveal visible particles. Intact core biopsy sample was obtained. IMPRESSION: CT guided bone marrow biopsy of right posterior iliac bone with both aspirate and core samples obtained. Electronically Signed   By: Aletta Edouard M.D.   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 Bone marrow biopsy is negative. MUGA scan and PFTs are  adequate to proceed with treatment. Patient tolerated cycle 1, day 1 of ABVD last week without significant side effects. He will also likely require OnPro Neulasta support, but will not initiate at this time. Patient will receive treatment on days 1 and 15 for a total of 6 cycles. Return to clinic in 1 week for consideration of cycle 1, day 15.  Approximately 20 minutes was spent in discussion of which greater than 50% was consultation.  Patient expressed understanding and was in agreement with this plan. He also understands that He can call clinic at any time with any questions, concerns, or complaints.   Cancer Staging Nodular sclerosis Hodgkin lymphoma of lymph nodes of neck (HCC) Staging form: Hodgkin and Non-Hodgkin Lymphoma, AJCC 8th Edition - Clinical stage from 05/09/2016: Stage III (Hodgkin lymphoma, A - Asymptomatic) - Signed by Lloyd Huger, MD on 05/09/2016   Lloyd Huger, MD   05/28/2016 3:10 PM

## 2016-05-24 ENCOUNTER — Inpatient Hospital Stay: Payer: BLUE CROSS/BLUE SHIELD

## 2016-05-24 ENCOUNTER — Inpatient Hospital Stay: Payer: BLUE CROSS/BLUE SHIELD | Attending: Oncology | Admitting: Oncology

## 2016-05-24 VITALS — BP 156/95 | HR 120 | Temp 97.6°F | Resp 18 | Wt 218.0 lb

## 2016-05-24 DIAGNOSIS — Z7689 Persons encountering health services in other specified circumstances: Secondary | ICD-10-CM | POA: Diagnosis not present

## 2016-05-24 DIAGNOSIS — C8111 Nodular sclerosis classical Hodgkin lymphoma, lymph nodes of head, face, and neck: Secondary | ICD-10-CM | POA: Insufficient documentation

## 2016-05-24 DIAGNOSIS — Z79899 Other long term (current) drug therapy: Secondary | ICD-10-CM | POA: Diagnosis not present

## 2016-05-24 DIAGNOSIS — M542 Cervicalgia: Secondary | ICD-10-CM | POA: Insufficient documentation

## 2016-05-24 DIAGNOSIS — Z87891 Personal history of nicotine dependence: Secondary | ICD-10-CM | POA: Insufficient documentation

## 2016-05-24 DIAGNOSIS — D709 Neutropenia, unspecified: Secondary | ICD-10-CM | POA: Diagnosis not present

## 2016-05-24 DIAGNOSIS — Z5111 Encounter for antineoplastic chemotherapy: Secondary | ICD-10-CM | POA: Insufficient documentation

## 2016-05-24 DIAGNOSIS — F419 Anxiety disorder, unspecified: Secondary | ICD-10-CM | POA: Insufficient documentation

## 2016-05-24 DIAGNOSIS — Z800 Family history of malignant neoplasm of digestive organs: Secondary | ICD-10-CM | POA: Insufficient documentation

## 2016-05-24 LAB — COMPREHENSIVE METABOLIC PANEL
ALT: 36 U/L (ref 17–63)
AST: 29 U/L (ref 15–41)
Albumin: 4.1 g/dL (ref 3.5–5.0)
Alkaline Phosphatase: 135 U/L — ABNORMAL HIGH (ref 38–126)
Anion gap: 9 (ref 5–15)
BILIRUBIN TOTAL: 0.3 mg/dL (ref 0.3–1.2)
BUN: 17 mg/dL (ref 6–20)
CHLORIDE: 101 mmol/L (ref 101–111)
CO2: 25 mmol/L (ref 22–32)
Calcium: 8.8 mg/dL — ABNORMAL LOW (ref 8.9–10.3)
Creatinine, Ser: 0.92 mg/dL (ref 0.61–1.24)
Glucose, Bld: 128 mg/dL — ABNORMAL HIGH (ref 65–99)
POTASSIUM: 3.7 mmol/L (ref 3.5–5.1)
Sodium: 135 mmol/L (ref 135–145)
TOTAL PROTEIN: 8.2 g/dL — AB (ref 6.5–8.1)

## 2016-05-24 LAB — CBC WITH DIFFERENTIAL/PLATELET
BASOS ABS: 0 10*3/uL (ref 0–0.1)
Basophils Relative: 1 %
Eosinophils Absolute: 0.1 10*3/uL (ref 0–0.7)
Eosinophils Relative: 5 %
HEMATOCRIT: 36.2 % — AB (ref 40.0–52.0)
Hemoglobin: 12.3 g/dL — ABNORMAL LOW (ref 13.0–18.0)
LYMPHS ABS: 1 10*3/uL (ref 1.0–3.6)
LYMPHS PCT: 36 %
MCH: 24.3 pg — AB (ref 26.0–34.0)
MCHC: 33.9 g/dL (ref 32.0–36.0)
MCV: 71.6 fL — AB (ref 80.0–100.0)
MONO ABS: 0 10*3/uL — AB (ref 0.2–1.0)
MONOS PCT: 2 %
NEUTROS ABS: 1.5 10*3/uL (ref 1.4–6.5)
Neutrophils Relative %: 56 %
PLATELETS: 320 10*3/uL (ref 150–440)
RBC: 5.06 MIL/uL (ref 4.40–5.90)
RDW: 16.1 % — AB (ref 11.5–14.5)
WBC: 2.7 10*3/uL — ABNORMAL LOW (ref 3.8–10.6)

## 2016-05-24 NOTE — Progress Notes (Signed)
Patient states he is having numbness and tingling in bilateral feet and up to his ankles.

## 2016-05-25 ENCOUNTER — Telehealth: Payer: Self-pay

## 2016-05-25 NOTE — Telephone Encounter (Signed)
Patient is asking is he can use any kind of medications like suppositories like preparation H for hemorrhoids.

## 2016-05-25 NOTE — Telephone Encounter (Signed)
Yes he can

## 2016-05-25 NOTE — Telephone Encounter (Signed)
Advised patient of message below and he voiced understanding.

## 2016-05-30 NOTE — Telephone Encounter (Signed)
This has been addressed.

## 2016-05-30 NOTE — Progress Notes (Signed)
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 Swelling in his neck has significantly decreased. He continues to be anxious, but this is improved as well. He currently feels well and is asymptomatic. He denies any fevers, night sweats, or weight loss. He has no neurologic complaints. He denies any dysphagia or difficulty swallowing. He has no chest pain or shortness of breath. He denies any nausea, vomiting, constipation, or diarrhea. He has no urinary complaints. Patient offers no further specific complaints.  REVIEW OF SYSTEMS:   Review of Systems  Constitutional: Negative.  Negative for fever, malaise/fatigue and weight loss.  HENT: Negative.   Respiratory: Negative.  Negative for cough and shortness of breath.   Cardiovascular: Negative.  Negative for chest pain and leg swelling.  Gastrointestinal: Negative.  Negative for abdominal pain.  Genitourinary: Negative.   Musculoskeletal: Positive for neck pain.  Neurological: Negative.  Negative for weakness.  Psychiatric/Behavioral: The patient is nervous/anxious.     As per HPI. Otherwise, a complete review of systems is negative.  PAST MEDICAL HISTORY: Past Medical History:  Diagnosis Date  . Medical history non-contributory     PAST SURGICAL HISTORY: Past Surgical History:  Procedure Laterality Date  . LYMPH NODE BIOPSY Right 04/13/2016   Procedure: biopsy of lymph nodes open deep cervical node (right);  Surgeon: Margaretha Sheffield, MD;  Location: [facility];  Service: ENT;  Laterality: Right;  . NO PAST SURGERIES    . PORTA CATH INSERTION N/A  05/15/2016   Procedure: Glori Luis Cath Insertion;  Surgeon: Katha Cabal, MD;  Location: [facility];  Service: Cardiovascular;  Laterality: N/A;    FAMILY HISTORY: Family History  Problem Relation Age of Onset  . Heart disease Father   . COPD Father   . Hypertension Father   . Hypertension Brother   . Alzheimer's disease Maternal Grandmother   . Pancreatic cancer Maternal Grandfather     ADVANCED DIRECTIVES (Y/N):  N  HEALTH MAINTENANCE: Social History  Substance Use Topics  . Smoking status: Former Research scientist (life sciences)  . Smokeless tobacco: Former Systems developer    Types: Snuff    Quit date: 05/25/2015     Comment: social in college  . Alcohol use 1.2 oz/week    2 Glasses of wine per week     Colonoscopy:  PAP:  Bone density:  Lipid panel:  No Known Allergies  Current Outpatient Prescriptions  Medication Sig Dispense Refill  . acetaminophen (TYLENOL) 325 MG tablet Take 650 mg by mouth every 6 (six) hours as needed for moderate pain or headache.    . lidocaine-prilocaine (EMLA) cream Apply to affected area once (Patient not taking: Reported on 05/31/2016) 30 g 3  . ondansetron (ZOFRAN) 8 MG tablet Take 1 tablet (8 mg total) by mouth 2 (two) times daily as needed. (Patient not taking: Reported on 05/31/2016) 30 tablet 1  . prochlorperazine (COMPAZINE) 10 MG tablet Take 1 tablet (10 mg total) by mouth every 6 (six) hours as needed (Nausea or vomiting). (Patient not taking: Reported on 05/31/2016) 30 tablet 1   Current Facility-Administered Medications  Medication Dose Route Frequency Provider Last Rate Last Dose  . ceFAZolin (  ANCEF) IVPB 1 g/50 mL premix  1 g Intravenous Once Katha Cabal, MD        OBJECTIVE: Vitals:   05/31/16 1542  BP: (!) 165/105  Pulse: (!) 108  Temp: 98.8 F (37.1 C)     Body mass index is 33.74 kg/m.    ECOG FS:0 - Asymptomatic  General: Well-developed, well-nourished, no acute distress. Eyes: Pink conjunctiva, anicteric sclera. HEENT: Large easily  palpable right neck mass, significantly decreased in size. Lungs: Clear to auscultation bilaterally. Heart: Regular rate and rhythm. No rubs, murmurs, or gallops. Abdomen: Soft, nontender, nondistended. No organomegaly noted, normoactive bowel sounds. Musculoskeletal: No edema, cyanosis, or clubbing. Neuro: Alert, answering all questions appropriately. Cranial nerves grossly intact. Skin: No rashes or petechiae noted. Psych: Normal affect.   LAB RESULTS:  Lab Results  Component Value Date   NA 136 05/31/2016   K 3.6 05/31/2016   CL 102 05/31/2016   CO2 25 05/31/2016   GLUCOSE 113 (H) 05/31/2016   BUN 17 05/31/2016   CREATININE 0.84 05/31/2016   CALCIUM 8.9 05/31/2016   PROT 7.9 05/31/2016   ALBUMIN 4.2 05/31/2016   AST 42 (H) 05/31/2016   ALT 60 05/31/2016   ALKPHOS 128 (H) 05/31/2016   BILITOT 0.4 05/31/2016   GFRNONAA >60 05/31/2016   GFRAA >60 05/31/2016    Lab Results  Component Value Date   WBC 2.7 (L) 05/31/2016   NEUTROABS 0.6 (L) 05/31/2016   HGB 12.6 (L) 05/31/2016   HCT 36.8 (L) 05/31/2016   MCV 73.1 (L) 05/31/2016   PLT 298 05/31/2016     STUDIES: No results found.  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 Bone marrow biopsy is negative. MUGA scan and PFTs are adequate to proceed with treatment. Patient tolerated cycle 1, day 1 of ABVD without significant side effects. Given his neutropenia with cycle 1, will add OnPro Neulasta support for the remainder of the cycles. Patient will receive treatment on days 1 and 15 for a total of 6 cycles. Return to clinic tomorrow for treatment and then in 2 weeks for consideration of cycle 2, day 1. 2. Neutropenia: Proceed with treatment tomorrow. Neulasta with the remainder of the treatments as  above.  Approximately 30 minutes was spent in discussion of which greater than 50% was consultation.  Patient expressed understanding and was in agreement with this plan. He also understands that He can call clinic at any time with any questions, concerns, or complaints.   Cancer Staging Nodular sclerosis Hodgkin lymphoma of lymph nodes of neck (HCC) Staging form: Hodgkin and Non-Hodgkin Lymphoma, AJCC 8th Edition - Clinical stage from 05/09/2016: Stage III (Hodgkin lymphoma, A - Asymptomatic) - Signed by Lloyd Huger, MD on 05/09/2016   Lloyd Huger, MD   06/03/2016 7:52 PM

## 2016-05-31 ENCOUNTER — Inpatient Hospital Stay (HOSPITAL_BASED_OUTPATIENT_CLINIC_OR_DEPARTMENT_OTHER): Payer: BLUE CROSS/BLUE SHIELD | Admitting: Oncology

## 2016-05-31 ENCOUNTER — Inpatient Hospital Stay: Payer: BLUE CROSS/BLUE SHIELD

## 2016-05-31 VITALS — BP 165/105 | HR 108 | Temp 98.8°F | Wt 221.9 lb

## 2016-05-31 DIAGNOSIS — D709 Neutropenia, unspecified: Secondary | ICD-10-CM

## 2016-05-31 DIAGNOSIS — Z87891 Personal history of nicotine dependence: Secondary | ICD-10-CM | POA: Diagnosis not present

## 2016-05-31 DIAGNOSIS — Z79899 Other long term (current) drug therapy: Secondary | ICD-10-CM | POA: Diagnosis not present

## 2016-05-31 DIAGNOSIS — C8111 Nodular sclerosis classical Hodgkin lymphoma, lymph nodes of head, face, and neck: Secondary | ICD-10-CM | POA: Diagnosis not present

## 2016-05-31 DIAGNOSIS — F419 Anxiety disorder, unspecified: Secondary | ICD-10-CM | POA: Diagnosis not present

## 2016-05-31 DIAGNOSIS — Z800 Family history of malignant neoplasm of digestive organs: Secondary | ICD-10-CM

## 2016-05-31 DIAGNOSIS — M542 Cervicalgia: Secondary | ICD-10-CM | POA: Diagnosis not present

## 2016-05-31 LAB — COMPREHENSIVE METABOLIC PANEL
ALK PHOS: 128 U/L — AB (ref 38–126)
ALT: 60 U/L (ref 17–63)
ANION GAP: 9 (ref 5–15)
AST: 42 U/L — ABNORMAL HIGH (ref 15–41)
Albumin: 4.2 g/dL (ref 3.5–5.0)
BUN: 17 mg/dL (ref 6–20)
CALCIUM: 8.9 mg/dL (ref 8.9–10.3)
CO2: 25 mmol/L (ref 22–32)
CREATININE: 0.84 mg/dL (ref 0.61–1.24)
Chloride: 102 mmol/L (ref 101–111)
Glucose, Bld: 113 mg/dL — ABNORMAL HIGH (ref 65–99)
Potassium: 3.6 mmol/L (ref 3.5–5.1)
Sodium: 136 mmol/L (ref 135–145)
Total Bilirubin: 0.4 mg/dL (ref 0.3–1.2)
Total Protein: 7.9 g/dL (ref 6.5–8.1)

## 2016-05-31 LAB — CBC WITH DIFFERENTIAL/PLATELET
Basophils Absolute: 0 10*3/uL (ref 0–0.1)
Basophils Relative: 2 %
EOS PCT: 10 %
Eosinophils Absolute: 0.3 10*3/uL (ref 0–0.7)
HCT: 36.8 % — ABNORMAL LOW (ref 40.0–52.0)
HEMOGLOBIN: 12.6 g/dL — AB (ref 13.0–18.0)
LYMPHS ABS: 1.4 10*3/uL (ref 1.0–3.6)
LYMPHS PCT: 49 %
MCH: 25 pg — AB (ref 26.0–34.0)
MCHC: 34.1 g/dL (ref 32.0–36.0)
MCV: 73.1 fL — AB (ref 80.0–100.0)
MONOS PCT: 18 %
Monocytes Absolute: 0.5 10*3/uL (ref 0.2–1.0)
Neutro Abs: 0.6 10*3/uL — ABNORMAL LOW (ref 1.4–6.5)
Neutrophils Relative %: 21 %
PLATELETS: 298 10*3/uL (ref 150–440)
RBC: 5.04 MIL/uL (ref 4.40–5.90)
RDW: 17.3 % — ABNORMAL HIGH (ref 11.5–14.5)
WBC: 2.7 10*3/uL — AB (ref 3.8–10.6)

## 2016-05-31 NOTE — Progress Notes (Signed)
Patient here today for follow up.   

## 2016-06-01 ENCOUNTER — Other Ambulatory Visit: Payer: Self-pay | Admitting: Oncology

## 2016-06-01 ENCOUNTER — Inpatient Hospital Stay: Payer: BLUE CROSS/BLUE SHIELD

## 2016-06-01 VITALS — BP 135/88 | HR 98 | Temp 96.2°F | Resp 18

## 2016-06-01 DIAGNOSIS — C8111 Nodular sclerosis classical Hodgkin lymphoma, lymph nodes of head, face, and neck: Secondary | ICD-10-CM | POA: Diagnosis not present

## 2016-06-01 MED ORDER — SODIUM CHLORIDE 0.9 % IV SOLN
Freq: Once | INTRAVENOUS | Status: AC
Start: 2016-06-01 — End: 2016-06-01
  Administered 2016-06-01: 09:00:00 via INTRAVENOUS
  Filled 2016-06-01: qty 1000

## 2016-06-01 MED ORDER — PALONOSETRON HCL INJECTION 0.25 MG/5ML
0.2500 mg | Freq: Once | INTRAVENOUS | Status: AC
  Administered 2016-06-01: 0.25 mg via INTRAVENOUS
  Filled 2016-06-01: qty 5

## 2016-06-01 MED ORDER — VINBLASTINE SULFATE CHEMO INJECTION 1 MG/ML: 6.0000 mg/m2 | Freq: Once | INTRAVENOUS | Status: DC

## 2016-06-01 MED ORDER — SODIUM CHLORIDE 0.9 % IV SOLN
375.0000 mg/m2 | Freq: Once | INTRAVENOUS | Status: AC
  Administered 2016-06-01: 800 mg via INTRAVENOUS
  Filled 2016-06-01: qty 40

## 2016-06-01 MED ORDER — PEGFILGRASTIM 6 MG/0.6ML ~~LOC~~ PSKT
6.0000 mg | PREFILLED_SYRINGE | Freq: Once | SUBCUTANEOUS | Status: AC
  Administered 2016-06-01: 6 mg via SUBCUTANEOUS
  Filled 2016-06-01: qty 0.6

## 2016-06-01 MED ORDER — SODIUM CHLORIDE 0.9 % IV SOLN
10.0000 [IU]/m2 | Freq: Once | INTRAVENOUS | Status: AC
  Administered 2016-06-01: 21 [IU] via INTRAVENOUS
  Filled 2016-06-01: qty 7

## 2016-06-01 MED ORDER — DOXORUBICIN HCL CHEMO IV INJECTION 2 MG/ML
25.0000 mg/m2 | Freq: Once | INTRAVENOUS | Status: AC
  Administered 2016-06-01: 54 mg via INTRAVENOUS
  Filled 2016-06-01: qty 27

## 2016-06-01 MED ORDER — HEPARIN SOD (PORK) LOCK FLUSH 100 UNIT/ML IV SOLN
500.0000 [IU] | Freq: Once | INTRAVENOUS | Status: AC | PRN
  Administered 2016-06-01: 500 [IU]
  Filled 2016-06-01: qty 5

## 2016-06-01 MED ORDER — SODIUM CHLORIDE 0.9% FLUSH
10.0000 mL | INTRAVENOUS | Status: DC | PRN
  Administered 2016-06-01: 10 mL
  Filled 2016-06-01: qty 10

## 2016-06-01 MED ORDER — VINBLASTINE SULFATE CHEMO INJECTION 1 MG/ML
13.0000 mg | Freq: Once | INTRAVENOUS | Status: AC
  Administered 2016-06-01: 13 mg via INTRAVENOUS
  Filled 2016-06-01: qty 13

## 2016-06-01 MED ORDER — SODIUM CHLORIDE 0.9 % IV SOLN
Freq: Once | INTRAVENOUS | Status: AC
  Administered 2016-06-01: 09:00:00 via INTRAVENOUS
  Filled 2016-06-01: qty 5

## 2016-06-01 NOTE — Progress Notes (Signed)
ANC: 600. MD, Dr. Grayland Ormond, notified via telephone and already aware. Per MD order: proceed with scheduled treatment as ordered today.

## 2016-06-07 ENCOUNTER — Telehealth: Payer: Self-pay

## 2016-06-07 NOTE — Telephone Encounter (Signed)
-----   Message from Lloyd Huger, MD sent at 06/06/2016  5:07 PM EDT ----- Regarding: RE: Peer to Peer Denied.  I have to delay his treatment in order to get it approved.   ----- Message ----- From: Luretha Murphy, CMA Sent: 06/06/2016   3:54 PM To: Lloyd Huger, MD Subject: Peer to Peer                                   Rodena Piety called to let us know patient insurance denies Neulasta for patient. Please call [phone] to do Peer to Peer. OI:[id number]

## 2016-06-07 NOTE — Telephone Encounter (Signed)
He is worried about not seeing you next week, you will be out of office and he will be seeing Wyatt Bates

## 2016-06-07 NOTE — Telephone Encounter (Signed)
i'll talk to [place] so there is no change in the plan.

## 2016-06-07 NOTE — Telephone Encounter (Signed)
Patient has been notified and voiced understanding all questions have been answered.

## 2016-06-07 NOTE — Telephone Encounter (Signed)
Patient is calling about bone pain. He mentions last night was the worse pain ever. He has bone pain and muscle pain. He is taking Claritin and tylenol.

## 2016-06-14 ENCOUNTER — Inpatient Hospital Stay: Payer: BLUE CROSS/BLUE SHIELD

## 2016-06-14 ENCOUNTER — Inpatient Hospital Stay (HOSPITAL_BASED_OUTPATIENT_CLINIC_OR_DEPARTMENT_OTHER): Payer: BLUE CROSS/BLUE SHIELD | Admitting: Hematology and Oncology

## 2016-06-14 ENCOUNTER — Other Ambulatory Visit: Payer: Self-pay | Admitting: Hematology and Oncology

## 2016-06-14 ENCOUNTER — Encounter: Payer: Self-pay | Admitting: Hematology and Oncology

## 2016-06-14 VITALS — BP 147/94 | HR 102 | Temp 98.1°F | Resp 18 | Wt 218.5 lb

## 2016-06-14 DIAGNOSIS — F419 Anxiety disorder, unspecified: Secondary | ICD-10-CM | POA: Diagnosis not present

## 2016-06-14 DIAGNOSIS — Z800 Family history of malignant neoplasm of digestive organs: Secondary | ICD-10-CM

## 2016-06-14 DIAGNOSIS — Z7689 Persons encountering health services in other specified circumstances: Secondary | ICD-10-CM | POA: Diagnosis not present

## 2016-06-14 DIAGNOSIS — Z5111 Encounter for antineoplastic chemotherapy: Secondary | ICD-10-CM

## 2016-06-14 DIAGNOSIS — Z79899 Other long term (current) drug therapy: Secondary | ICD-10-CM | POA: Diagnosis not present

## 2016-06-14 DIAGNOSIS — Z87891 Personal history of nicotine dependence: Secondary | ICD-10-CM | POA: Diagnosis not present

## 2016-06-14 DIAGNOSIS — M542 Cervicalgia: Secondary | ICD-10-CM

## 2016-06-14 DIAGNOSIS — C8111 Nodular sclerosis classical Hodgkin lymphoma, lymph nodes of head, face, and neck: Secondary | ICD-10-CM | POA: Diagnosis not present

## 2016-06-14 LAB — CBC WITH DIFFERENTIAL/PLATELET
BASOS PCT: 1 %
Basophils Absolute: 0.1 10*3/uL (ref 0–0.1)
EOS ABS: 0.2 10*3/uL (ref 0–0.7)
EOS PCT: 1 %
HCT: 35.3 % — ABNORMAL LOW (ref 40.0–52.0)
HEMOGLOBIN: 12.4 g/dL — AB (ref 13.0–18.0)
Lymphocytes Relative: 10 %
Lymphs Abs: 1.8 10*3/uL (ref 1.0–3.6)
MCH: 25.6 pg — AB (ref 26.0–34.0)
MCHC: 35.2 g/dL (ref 32.0–36.0)
MCV: 72.7 fL — ABNORMAL LOW (ref 80.0–100.0)
MONOS PCT: 4 %
Monocytes Absolute: 0.7 10*3/uL (ref 0.2–1.0)
NEUTROS PCT: 84 %
Neutro Abs: 15.8 10*3/uL — ABNORMAL HIGH (ref 1.4–6.5)
PLATELETS: 151 10*3/uL (ref 150–440)
RBC: 4.85 MIL/uL (ref 4.40–5.90)
RDW: 19 % — ABNORMAL HIGH (ref 11.5–14.5)
WBC: 18.6 10*3/uL — AB (ref 3.8–10.6)

## 2016-06-14 LAB — COMPREHENSIVE METABOLIC PANEL
ALBUMIN: 4.5 g/dL (ref 3.5–5.0)
ALK PHOS: 129 U/L — AB (ref 38–126)
ALT: 59 U/L (ref 17–63)
ANION GAP: 9 (ref 5–15)
AST: 39 U/L (ref 15–41)
BUN: 21 mg/dL — ABNORMAL HIGH (ref 6–20)
CALCIUM: 8.9 mg/dL (ref 8.9–10.3)
CHLORIDE: 107 mmol/L (ref 101–111)
CO2: 24 mmol/L (ref 22–32)
Creatinine, Ser: 1.23 mg/dL (ref 0.61–1.24)
GFR calc non Af Amer: >60 mL/min (ref >60)
Glucose, Bld: 93 mg/dL (ref 65–99)
POTASSIUM: 4 mmol/L (ref 3.5–5.1)
SODIUM: 140 mmol/L (ref 135–145)
Total Bilirubin: 0.6 mg/dL (ref 0.3–1.2)
Total Protein: 7.8 g/dL (ref 6.5–8.1)

## 2016-06-14 NOTE — Progress Notes (Signed)
Patient states when he got his Neulasta after his last treatment. Neulasta activated Saturday.  On Sunday bones felt heavy.  Monday 7/10 bone/muscle pain.  Tuesday was fine but on Wednesday patient was debilitated.  States he was SOB with even walking across the room.  Wednesday was worse. (Hips/back).  Otherwise, no complaints.

## 2016-06-14 NOTE — Progress Notes (Signed)
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 Symptomatically, he feels "perfectly fine".  He denies any side effects from chemotherapy.  He did experience terrible Neulasta induced bone pain.Wyatt Bates  He took Claritin.  He notes dramatic shrinkage in his neck nodes.  He has been active cutting the grass.  He has been hitting some golf balls.  He feels well. He denies any fevers, night sweats, or weight loss. He has no neurologic complaints. He denies any dysphagia or difficulty swallowing. He has no chest pain or shortness of breath. He denies any nausea, vomiting, constipation, or diarrhea. He has no urinary complaints. Patient offers no further specific complaints.  REVIEW OF SYSTEMS:   Review of Systems  Constitutional: Negative for diaphoresis, fever, malaise/fatigue and weight loss.  HENT: Negative for congestion, hearing loss, sore throat and tinnitus.   Eyes: Negative for blurred vision and double vision.  Respiratory: Negative for cough, hemoptysis, sputum production, shortness of breath and wheezing.   Cardiovascular: Negative for chest pain, palpitations, orthopnea, leg swelling and PND.  Gastrointestinal: Negative for abdominal pain, blood in stool, constipation, diarrhea, heartburn, melena, nausea and vomiting.  Genitourinary: Negative for dysuria, frequency, hematuria and urgency.  Musculoskeletal: Negative for back pain, joint pain, myalgias and neck pain.  Skin: Negative for itching and rash.  Neurological: Negative.  Negative for dizziness, tingling, sensory change, focal weakness, weakness and  headaches.  Endo/Heme/Allergies: Does not bruise/bleed easily.  Psychiatric/Behavioral: Negative for depression. The patient is not nervous/anxious.     As per HPI. Otherwise, a complete review of systems is negative.  PAST MEDICAL HISTORY: Past Medical History:  Diagnosis Date  . Medical history non-contributory     PAST SURGICAL HISTORY: Past Surgical History:  Procedure Laterality Date  . LYMPH NODE BIOPSY Right 04/13/2016   Procedure: biopsy of lymph nodes open deep cervical node (right);  Surgeon: Margaretha Sheffield, MD;  Location: [facility];  Service: ENT;  Laterality: Right;  . NO PAST SURGERIES    . PORTA CATH INSERTION N/A 05/15/2016   Procedure: Glori Luis Cath Insertion;  Surgeon: Katha Cabal, MD;  Location: [facility];  Service: Cardiovascular;  Laterality: N/A;    FAMILY HISTORY: Family History  Problem Relation Age of Onset  . Heart disease Father   . COPD Father   . Hypertension Father   . Hypertension Brother   . Alzheimer's disease Maternal Grandmother   . Pancreatic cancer Maternal Grandfather     ADVANCED DIRECTIVES (Y/N):  N  HEALTH MAINTENANCE: Social History  Substance Use Topics  . Smoking status: Former Research scientist (life sciences)  . Smokeless tobacco: Former Systems developer    Types: Snuff    Quit date: 05/25/2015     Comment: social in college  . Alcohol use 1.2 oz/week    2 Glasses of wine per week     Colonoscopy:  PAP:  Bone density:  Lipid panel:  No Known Allergies  Current Outpatient Prescriptions  Medication Sig Dispense Refill  . acetaminophen (TYLENOL) 325 MG tablet Take 650 mg by mouth every 6 (six) hours as needed for moderate pain or headache.    . lidocaine-prilocaine (EMLA) cream Apply to  affected area once 30 g 3  . ondansetron (ZOFRAN) 8 MG tablet Take 1 tablet (8 mg total) by mouth 2 (two) times daily as needed. (Patient not taking: Reported on 05/31/2016) 30 tablet 1  . prochlorperazine (COMPAZINE) 10 MG tablet Take 1 tablet (10 mg  total) by mouth every 6 (six) hours as needed (Nausea or vomiting). (Patient not taking: Reported on 05/31/2016) 30 tablet 1   Current Facility-Administered Medications  Medication Dose Route Frequency Provider Last Rate Last Dose  . ceFAZolin (ANCEF) IVPB 1 g/50 mL premix  1 g Intravenous Once Katha Cabal, MD        OBJECTIVE: Vitals:   06/14/16 1624  BP: (!) 150/95  Pulse: (!) 109  Resp: 18  Temp: 98.1 F (36.7 C)     Body mass index is 33.22 kg/m.    ECOG FS:0 - Asymptomatic   GENERAL:  Well developed, well nourished, gentleman sitting comfortably in the exam room in no acute distress. MENTAL STATUS:  Alert and oriented to person, place and time. HEAD: Short black hair.  Normocephalic, atraumatic, face symmetric, no Cushingoid features. EYES:  Blue eyes.  Pupils equal round and reactive to light and accomodation.  No conjunctivitis or scleral icterus. ENT:  Oropharynx clear without lesion.  Tongue normal. Mucous membranes moist.  RESPIRATORY:  Clear to auscultation without rales, wheezes or rhonchi. CARDIOVASCULAR:  Regular rate and rhythm without murmur, rub or gallop. ABDOMEN:  Soft, non-tender, with active bowel sounds, and no hepatosplenomegaly.  No masses. SKIN:  No rashes, ulcers or lesions. EXTREMITIES: No edema, no skin discoloration or tenderness.  No palpable cords. LYMPH NODES: two fingertip right posterior cervical nodes.  No palpable supraclavicular, axillary or inguinal adenopathy  NEUROLOGICAL: Unremarkable. PSYCH:  Appropriate.     LAB RESULTS:  Lab Results  Component Value Date   NA 136 05/31/2016   K 3.6 05/31/2016   CL 102 05/31/2016   CO2 25 05/31/2016   GLUCOSE 113 (H) 05/31/2016   BUN 17 05/31/2016   CREATININE 0.84 05/31/2016   CALCIUM 8.9 05/31/2016   PROT 7.9 05/31/2016   ALBUMIN 4.2 05/31/2016   AST 42 (H) 05/31/2016   ALT 60 05/31/2016   ALKPHOS 128 (H) 05/31/2016   BILITOT 0.4 05/31/2016   GFRNONAA >60 05/31/2016   GFRAA >60  05/31/2016    Lab Results  Component Value Date   WBC 18.6 (H) 06/14/2016   NEUTROABS 15.8 (H) 06/14/2016   HGB 12.4 (L) 06/14/2016   HCT 35.3 (L) 06/14/2016   MCV 72.7 (L) 06/14/2016   PLT 151 06/14/2016     STUDIES: No results found.  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 Bone marrow biopsy is negative. MUGA scan and PFTs are adequate to proceed with treatment.   Patient tolerated cycle 1, day 1 of ABVD without significant side effects. Given his neutropenia with cycle 1 day 1, OnPro Neulasta was added with cycle 1, day 15.   Counts were good and remain good (WBC 18,600).  Patient will not receive Neulasta with this cycle.  Anticipate future cycles will require Neulsata support to remain on track and prevent fever and neutropenia.     Patient will receive treatment on days 1 and 15 for a total of 6 cycles. Return to clinic tomorrow for cycle 2, day  1 treatment and then in 2 weeks for consideration of cycle 2, day 15.  2. Neutropenia: Resolved after last cycle and treatment with Neulsta.  Approximately 30 minutes was spent in discussion of which greater than 50% was consultation.  Patient expressed understanding and was in agreement with this plan. He also understands that He can call clinic at any time with any questions, concerns, or complaints.   Cancer Staging Nodular sclerosis Hodgkin lymphoma of lymph nodes of neck ([place]) Staging form: Hodgkin and Non-Hodgkin Lymphoma, AJCC 8th Edition - Clinical stage from 05/09/2016: Stage III (Hodgkin lymphoma, A - Asymptomatic) - Signed by Lloyd Huger, MD on 05/09/2016   Lequita Asal, MD   06/14/2016 4:40 PM

## 2016-06-15 ENCOUNTER — Inpatient Hospital Stay: Payer: BLUE CROSS/BLUE SHIELD

## 2016-06-15 VITALS — BP 129/80 | HR 73 | Temp 96.5°F | Resp 18

## 2016-06-15 DIAGNOSIS — C8111 Nodular sclerosis classical Hodgkin lymphoma, lymph nodes of head, face, and neck: Secondary | ICD-10-CM

## 2016-06-15 MED ORDER — SODIUM CHLORIDE 0.9 % IV SOLN
13.0000 mg | Freq: Once | INTRAVENOUS | Status: AC
  Administered 2016-06-15: 13 mg via INTRAVENOUS
  Filled 2016-06-15: qty 13

## 2016-06-15 MED ORDER — SODIUM CHLORIDE 0.9 % IV SOLN
Freq: Once | INTRAVENOUS | Status: AC
Start: 2016-06-15 — End: 2016-06-15
  Administered 2016-06-15: 10:00:00 via INTRAVENOUS
  Filled 2016-06-15: qty 1000

## 2016-06-15 MED ORDER — SODIUM CHLORIDE 0.9 % IV SOLN
375.0000 mg/m2 | Freq: Once | INTRAVENOUS | Status: AC
  Administered 2016-06-15: 800 mg via INTRAVENOUS
  Filled 2016-06-15: qty 40

## 2016-06-15 MED ORDER — HEPARIN SOD (PORK) LOCK FLUSH 100 UNIT/ML IV SOLN
500.0000 [IU] | Freq: Once | INTRAVENOUS | Status: AC | PRN
  Administered 2016-06-15: 500 [IU]
  Filled 2016-06-15: qty 5

## 2016-06-15 MED ORDER — SODIUM CHLORIDE 0.9 % IV SOLN
10.0000 [IU]/m2 | Freq: Once | INTRAVENOUS | Status: AC
  Administered 2016-06-15: 21 [IU] via INTRAVENOUS
  Filled 2016-06-15: qty 7

## 2016-06-15 MED ORDER — SODIUM CHLORIDE 0.9 % IV SOLN
Freq: Once | INTRAVENOUS | Status: AC
  Administered 2016-06-15: 10:00:00 via INTRAVENOUS
  Filled 2016-06-15: qty 5

## 2016-06-15 MED ORDER — PALONOSETRON HCL INJECTION 0.25 MG/5ML
0.2500 mg | Freq: Once | INTRAVENOUS | Status: AC
  Administered 2016-06-15: 0.25 mg via INTRAVENOUS
  Filled 2016-06-15: qty 5

## 2016-06-15 MED ORDER — DOXORUBICIN HCL CHEMO IV INJECTION 2 MG/ML
25.0000 mg/m2 | Freq: Once | INTRAVENOUS | Status: AC
Start: 2016-06-15 — End: 2016-06-15
  Administered 2016-06-15: 54 mg via INTRAVENOUS
  Filled 2016-06-15: qty 27

## 2016-06-15 MED ORDER — SODIUM CHLORIDE 0.9% FLUSH
10.0000 mL | INTRAVENOUS | Status: DC | PRN
  Administered 2016-06-15: 10 mL
  Filled 2016-06-15: qty 10

## 2016-06-15 NOTE — Progress Notes (Signed)
Per MD, Dr. Grayland Ormond, order: patient should not receive Neulasta On-Pro today.

## 2016-06-18 ENCOUNTER — Ambulatory Visit (INDEPENDENT_AMBULATORY_CARE_PROVIDER_SITE_OTHER): Payer: BLUE CROSS/BLUE SHIELD | Admitting: Primary Care

## 2016-06-18 ENCOUNTER — Encounter: Payer: Self-pay | Admitting: Primary Care

## 2016-06-18 DIAGNOSIS — R030 Elevated blood-pressure reading, without diagnosis of hypertension: Secondary | ICD-10-CM | POA: Diagnosis not present

## 2016-06-18 DIAGNOSIS — C8111 Nodular sclerosis classical Hodgkin lymphoma, lymph nodes of head, face, and neck: Secondary | ICD-10-CM

## 2016-06-18 NOTE — Patient Instructions (Signed)
Continue to monitor your blood pressure. Report consistent readings at or above 135/90.  Work on a balanced diet with fresh fruits, vegetables, whole grains, water.  Reduced consumption of boxed/canned/frozen foods as these contain a lot of salt.   Follow up in 6 months for re-evaluation.  It was a pleasure to see you today!   DASH Eating Plan DASH stands for "Dietary Approaches to Stop Hypertension." The DASH eating plan is a healthy eating plan that has been shown to reduce high blood pressure (hypertension). It may also reduce your risk for type 2 diabetes, heart disease, and stroke. The DASH eating plan may also help with weight loss. What are tips for following this plan? General guidelines   Avoid eating more than 2,300 mg (milligrams) of salt (sodium) a day. If you have hypertension, you may need to reduce your sodium intake to 1,500 mg a day.  Limit alcohol intake to no more than 1 drink a day for nonpregnant women and 2 drinks a day for men. One drink equals 12 oz of beer, 5 oz of wine, or 1 oz of hard liquor.  Work with your health care provider to maintain a healthy body weight or to lose weight. Ask what an ideal weight is for you.  Get at least 30 minutes of exercise that causes your heart to beat faster (aerobic exercise) most days of the week. Activities may include walking, swimming, or biking.  Work with your health care provider or diet and nutrition specialist (dietitian) to adjust your eating plan to your individual calorie needs. Reading food labels   Check food labels for the amount of sodium per serving. Choose foods with less than 5 percent of the Daily Value of sodium. Generally, foods with less than 300 mg of sodium per serving fit into this eating plan.  To find whole grains, look for the word "whole" as the first word in the ingredient list. Shopping   Buy products labeled as "low-sodium" or "no salt added."  Buy fresh foods. Avoid canned foods and  premade or frozen meals. Cooking   Avoid adding salt when cooking. Use salt-free seasonings or herbs instead of table salt or sea salt. Check with your health care provider or pharmacist before using salt substitutes.  Do not fry foods. Cook foods using healthy methods such as baking, boiling, grilling, and broiling instead.  Cook with heart-healthy oils, such as olive, canola, soybean, or sunflower oil. Meal planning    Eat a balanced diet that includes:  5 or more servings of fruits and vegetables each day. At each meal, try to fill half of your plate with fruits and vegetables.  Up to 6-8 servings of whole grains each day.  Less than 6 oz of lean meat, poultry, or fish each day. A 3-oz serving of meat is about the same size as a deck of cards. One egg equals 1 oz.  2 servings of low-fat dairy each day.  A serving of nuts, seeds, or beans 5 times each week.  Heart-healthy fats. Healthy fats called Omega-3 fatty acids are found in foods such as flaxseeds and coldwater fish, like sardines, salmon, and mackerel.  Limit how much you eat of the following:  Canned or prepackaged foods.  Food that is high in trans fat, such as fried foods.  Food that is high in saturated fat, such as fatty meat.  Sweets, desserts, sugary drinks, and other foods with added sugar.  Full-fat dairy products.  Do not salt foods  before eating.  Try to eat at least 2 vegetarian meals each week.  Eat more home-cooked food and less restaurant, buffet, and fast food.  When eating at a restaurant, ask that your food be prepared with less salt or no salt, if possible. What foods are recommended? The items listed may not be a complete list. Talk with your dietitian about what dietary choices are best for you. Grains  Whole-grain or whole-wheat bread. Whole-grain or whole-wheat pasta. Brown rice. [place]ena Morrow. Bulgur. Whole-grain and low-sodium cereals. Pita bread. Low-fat, low-sodium crackers.  Whole-wheat flour tortillas. Vegetables  Fresh or frozen vegetables (raw, steamed, roasted, or grilled). Low-sodium or reduced-sodium tomato and vegetable juice. Low-sodium or reduced-sodium tomato sauce and tomato paste. Low-sodium or reduced-sodium canned vegetables. Fruits  All fresh, dried, or frozen fruit. Canned fruit in natural juice (without added sugar). Meat and other protein foods  Skinless chicken or [place]. Ground chicken or [place]. Pork with fat trimmed off. Fish and seafood. Egg whites. Dried beans, peas, or lentils. Unsalted nuts, nut butters, and seeds. Unsalted canned beans. Lean cuts of beef with fat trimmed off. Low-sodium, lean deli meat. Dairy  Low-fat (1%) or fat-free (skim) milk. Fat-free, low-fat, or reduced-fat cheeses. Nonfat, low-sodium ricotta or cottage cheese. Low-fat or nonfat yogurt. Low-fat, low-sodium cheese. Fats and oils  Soft margarine without trans fats. Vegetable oil. Low-fat, reduced-fat, or light mayonnaise and salad dressings (reduced-sodium). Canola, safflower, olive, soybean, and sunflower oils. Avocado. Seasoning and other foods  Herbs. Spices. Seasoning mixes without salt. Unsalted popcorn and pretzels. Fat-free sweets. What foods are not recommended? The items listed may not be a complete list. Talk with your dietitian about what dietary choices are best for you. Grains  Baked goods made with fat, such as croissants, muffins, or some breads. Dry pasta or rice meal packs. Vegetables  Creamed or fried vegetables. Vegetables in a cheese sauce. Regular canned vegetables (not low-sodium or reduced-sodium). Regular canned tomato sauce and paste (not low-sodium or reduced-sodium). Regular tomato and vegetable juice (not low-sodium or reduced-sodium). Angie Fava. Olives. Fruits  Canned fruit in a light or heavy syrup. Fried fruit. Fruit in cream or butter sauce. Meat and other protein foods  Fatty cuts of meat. Ribs. Fried meat. Berniece Salines. Sausage. Bologna and  other processed lunch meats. Salami. Fatback. Hotdogs. Bratwurst. Salted nuts and seeds. Canned beans with added salt. Canned or smoked fish. Whole eggs or egg yolks. Chicken or [place] with skin. Dairy  Whole or 2% milk, cream, and half-and-half. Whole or full-fat cream cheese. Whole-fat or sweetened yogurt. Full-fat cheese. Nondairy creamers. Whipped toppings. Processed cheese and cheese spreads. Fats and oils  Butter. Stick margarine. Lard. Shortening. Ghee. Bacon fat. Tropical oils, such as coconut, palm kernel, or palm oil. Seasoning and other foods  Salted popcorn and pretzels. Onion salt, garlic salt, seasoned salt, table salt, and sea salt. Worcestershire sauce. Tartar sauce. Barbecue sauce. Teriyaki sauce. Soy sauce, including reduced-sodium. Steak sauce. Canned and packaged gravies. Fish sauce. Oyster sauce. Cocktail sauce. Horseradish that you find on the shelf. Ketchup. Mustard. Meat flavorings and tenderizers. Bouillon cubes. Hot sauce and Tabasco sauce. Premade or packaged marinades. Premade or packaged taco seasonings. Relishes. Regular salad dressings. Where to find more information:  National Heart, Lung, and Queets: https://wilson-eaton.com/  American Heart Association: www.heart.org Summary  The DASH eating plan is a healthy eating plan that has been shown to reduce high blood pressure (hypertension). It may also reduce your risk for type 2 diabetes, heart disease, and stroke.  With the DASH eating plan, you should limit salt (sodium) intake to 2,300 mg a day. If you have hypertension, you may need to reduce your sodium intake to 1,500 mg a day.  When on the DASH eating plan, aim to eat more fresh fruits and vegetables, whole grains, lean proteins, low-fat dairy, and heart-healthy fats.  Work with your health care provider or diet and nutrition specialist (dietitian) to adjust your eating plan to your individual calorie needs. This information is not intended to replace advice  given to you by your health care provider. Make sure you discuss any questions you have with your health care provider. Document Released: 03/01/2011 Document Revised: 03/05/2016 Document Reviewed: 03/05/2016 Elsevier Interactive Patient Education  2017 [employer].

## 2016-06-18 NOTE — Assessment & Plan Note (Signed)
Initially above goal, improved on recheck. Home readings in the 130's/80's. Office readings higher. Will continue to monitor at this time. Will have him work on improvements in his diet through adding vegetables/fruit/whole grains, reducing salt. He will report consistent readings at or above 135/90.

## 2016-06-18 NOTE — Progress Notes (Signed)
Pre visit review using our clinic review tool, if applicable. No additional management support is needed unless otherwise documented below in the visit note. 

## 2016-06-18 NOTE — Progress Notes (Signed)
Subjective:    Patient ID: Wyatt Bates, male    DOB: 05-Apr-1975, 41 y.o.   MRN: [id number]  HPI  Mr. Brann is a 41 year old male with a history of Hodgkin Lymphoma and elevated blood pressure readings who presents today for follow up of blood pressure. Since his initial visit in our clinic he's had documented readings in various clinics that were above and at goal. Most office visit readings from other providers range from 150-180/90's.   His BP in the office today is 144/84. He's checking his BP at work which is running 130's/80's. He does have a history of elevated readings in offices. He's recently been doing essential oils at home which has helped with anxiety/stress. He's currently undergoing chemotherapy. He's been unable to lose weight as he was told not to do so. He does endorse a poor diet full of junk food. He denies chest pain, dizziness, visual changes.  Review of Systems  Eyes: Negative for visual disturbance.  Respiratory: Negative for shortness of breath.   Cardiovascular: Negative for chest pain.  Neurological: Negative for dizziness and headaches.       Past Medical History:  Diagnosis Date  . Medical history non-contributory      Social History   Social History  . Marital status: Married    Spouse name: N/A  . Number of children: N/A  . Years of education: N/A   Occupational History  . Not on file.   Social History Main Topics  . Smoking status: Former Research scientist (life sciences)  . Smokeless tobacco: Former Systems developer    Types: Snuff    Quit date: 05/25/2015     Comment: social in college  . Alcohol use 1.2 oz/week    2 Glasses of wine per week  . Drug use: No  . Sexual activity: Not on file   Other Topics Concern  . Not on file   Social History Narrative   Married.   2 children.   Works in [employer].   Enjoys golf, baseball, hunting, spending time with family.    Past Surgical History:  Procedure Laterality Date  . LYMPH NODE BIOPSY Right 04/13/2016   Procedure:  biopsy of lymph nodes open deep cervical node (right);  Surgeon: Margaretha Sheffield, MD;  Location: [facility];  Service: ENT;  Laterality: Right;  . NO PAST SURGERIES    . PORTA CATH INSERTION N/A 05/15/2016   Procedure: Glori Luis Cath Insertion;  Surgeon: Katha Cabal, MD;  Location: [facility];  Service: Cardiovascular;  Laterality: N/A;    Family History  Problem Relation Age of Onset  . Heart disease Father   . COPD Father   . Hypertension Father   . Hypertension Brother   . Alzheimer's disease Maternal Grandmother   . Pancreatic cancer Maternal Grandfather     No Known Allergies  Current Outpatient Prescriptions on File Prior to Visit  Medication Sig Dispense Refill  . acetaminophen (TYLENOL) 325 MG tablet Take 650 mg by mouth every 6 (six) hours as needed for moderate pain or headache.    . lidocaine-prilocaine (EMLA) cream Apply to affected area once 30 g 3  . ondansetron (ZOFRAN) 8 MG tablet Take 1 tablet (8 mg total) by mouth 2 (two) times daily as needed. (Patient not taking: Reported on 06/18/2016) 30 tablet 1  . prochlorperazine (COMPAZINE) 10 MG tablet Take 1 tablet (10 mg total) by mouth every 6 (six) hours as needed (Nausea or vomiting). (Patient not taking: Reported on  06/18/2016) 30 tablet 1   Current Facility-Administered Medications on File Prior to Visit  Medication Dose Route Frequency Provider Last Rate Last Dose  . ceFAZolin (ANCEF) IVPB 1 g/50 mL premix  1 g Intravenous Once Katha Cabal, MD        BP (!) 144/84   Pulse 75   Temp 97.8 F (36.6 C) (Oral)   Ht 5\' 8"  (1.727 m)   Wt 222 lb (100.7 kg)   SpO2 96%   BMI 33.75 kg/m    Objective:   Physical Exam  Constitutional: He appears well-nourished.  Cardiovascular: Normal rate and regular rhythm.   Pulmonary/Chest: Effort normal and breath sounds normal.  Skin: Skin is warm and dry.          Assessment & Plan:

## 2016-06-18 NOTE — Assessment & Plan Note (Signed)
Following with oncology, going through chemotherapy. Overall doing well.

## 2016-06-23 DIAGNOSIS — Z5111 Encounter for antineoplastic chemotherapy: Secondary | ICD-10-CM | POA: Insufficient documentation

## 2016-06-27 NOTE — Progress Notes (Signed)
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 Swelling in his neck has significantly decreased and has nearly resolved. He continues to be anxious, but this is improved as well. He currently feels well and is asymptomatic. He denies any fevers, night sweats, or weight loss. He has no neurologic complaints. He denies any dysphagia or difficulty swallowing. He has no chest pain or shortness of breath. He denies any nausea, vomiting, constipation, or diarrhea. He has no urinary complaints. Patient offers no specific complaints today.  REVIEW OF SYSTEMS:   Review of Systems  Constitutional: Negative.  Negative for fever, malaise/fatigue and weight loss.  HENT: Negative.   Respiratory: Negative.  Negative for cough and shortness of breath.   Cardiovascular: Negative.  Negative for chest pain and leg swelling.  Gastrointestinal: Negative.  Negative for abdominal pain.  Genitourinary: Negative.   Musculoskeletal: Negative.  Negative for neck pain.  Neurological: Negative.  Negative for weakness.  Psychiatric/Behavioral: The patient is not nervous/anxious.     As per HPI. Otherwise, a complete review of systems is negative.  PAST MEDICAL HISTORY: Past Medical History:  Diagnosis Date  . Medical history non-contributory     PAST SURGICAL HISTORY: Past Surgical History:  Procedure Laterality Date  . LYMPH NODE BIOPSY Right 04/13/2016   Procedure: biopsy of lymph nodes open deep cervical node (right);  Surgeon: Margaretha Sheffield, MD;  Location: [facility];  Service: ENT;  Laterality: Right;  . NO PAST  SURGERIES    . PORTA CATH INSERTION N/A 05/15/2016   Procedure: Glori Luis Cath Insertion;  Surgeon: Katha Cabal, MD;  Location: [facility] [facility];  Service: Cardiovascular;  Laterality: N/A;    FAMILY HISTORY: Family History  Problem Relation Age of Onset  . Heart disease Father   . COPD Father   . Hypertension Father   . Hypertension Brother   . Alzheimer's disease Maternal Grandmother   . Pancreatic cancer Maternal Grandfather     ADVANCED DIRECTIVES (Y/N):  N  HEALTH MAINTENANCE: Social History  Substance Use Topics  . Smoking status: Former Research scientist (life sciences)  . Smokeless tobacco: Former Systems developer    Types: Snuff    Quit date: 05/25/2015     Comment: social in college  . Alcohol use 1.2 oz/week    2 Glasses of wine per week     Colonoscopy:  PAP:  Bone density:  Lipid panel:  No Known Allergies  Current Outpatient Prescriptions  Medication Sig Dispense Refill  . acetaminophen (TYLENOL) 325 MG tablet Take 650 mg by mouth every 6 (six) hours as needed for moderate pain or headache.    . lidocaine-prilocaine (EMLA) cream Apply to affected area once 30 g 3  . ondansetron (ZOFRAN) 8 MG tablet Take 1 tablet (8 mg total) by mouth 2 (two) times daily as needed. (Patient not taking: Reported on 06/18/2016) 30 tablet 1  . prochlorperazine (COMPAZINE) 10 MG tablet Take 1 tablet (10 mg total) by mouth every 6 (six) hours as needed (Nausea or vomiting). (Patient not taking: Reported on 06/18/2016) 30 tablet 1   Current Facility-Administered Medications  Medication Dose Route Frequency Provider Last Rate Last Dose  .  ceFAZolin (ANCEF) IVPB 1 g/50 mL premix  1 g Intravenous Once Katha Cabal, MD        OBJECTIVE: Vitals:   06/28/16 1514  BP: (!) 161/91  Pulse: (!) 114  Resp: 18  Temp: 98.6 F (37 C)     Body mass index is 33.45 kg/m.    ECOG FS:0 - Asymptomatic  General: Well-developed, well-nourished, no acute distress. Eyes: Pink conjunctiva, anicteric sclera. HEENT: Large  easily palpable right neck mass, nearly resolved. Lungs: Clear to auscultation bilaterally. Heart: Regular rate and rhythm. No rubs, murmurs, or gallops. Abdomen: Soft, nontender, nondistended. No organomegaly noted, normoactive bowel sounds. Musculoskeletal: No edema, cyanosis, or clubbing. Neuro: Alert, answering all questions appropriately. Cranial nerves grossly intact. Skin: No rashes or petechiae noted. Psych: Normal affect.   LAB RESULTS:  Lab Results  Component Value Date   NA 137 06/28/2016   K 3.5 06/28/2016   CL 107 06/28/2016   CO2 26 06/28/2016   GLUCOSE 122 (H) 06/28/2016   BUN 12 06/28/2016   CREATININE 0.86 06/28/2016   CALCIUM 8.8 (L) 06/28/2016   PROT 7.2 06/28/2016   ALBUMIN 4.3 06/28/2016   AST 44 (H) 06/28/2016   ALT 78 (H) 06/28/2016   ALKPHOS 99 06/28/2016   BILITOT 0.5 06/28/2016   GFRNONAA >60 06/28/2016   GFRAA >60 06/28/2016    Lab Results  Component Value Date   WBC 2.6 (L) 06/28/2016   NEUTROABS 0.9 (L) 06/28/2016   HGB 12.0 (L) 06/28/2016   HCT 34.2 (L) 06/28/2016   MCV 74.7 (L) 06/28/2016   PLT 292 06/28/2016     STUDIES: No results found.  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 Bone marrow biopsy is negative. MUGA scan and PFTs are adequate to proceed with treatment. Because of patient's persistent neutropenia, we will delay treatment 1 week and add OnPro Neulasta support for the remainder of the cycles. Patient will receive treatment on days 1 and 15 for a total of 6 cycles, provided his blood counts tolerate this schedule. Return to clinic in 1 week for reconsideration of cycle 2, day 1 and then in 3 weeks for consideration of cycle 2, day 15. 2. Neutropenia: Delay treatment 1 week as above. Neulasta with the  remainder of the treatments as above.  Approximately 30 minutes was spent in discussion of which greater than 50% was consultation.  Patient expressed understanding and was in agreement with this plan. He also understands that He can call clinic at any time with any questions, concerns, or complaints.   Cancer Staging Nodular sclerosis Hodgkin lymphoma of lymph nodes of neck (HCC) Staging form: Hodgkin and Non-Hodgkin Lymphoma, AJCC 8th Edition - Clinical stage from 05/09/2016: Stage III (Hodgkin lymphoma, A - Asymptomatic) - Signed by Lloyd Huger, MD on 05/09/2016   Lloyd Huger, MD   06/28/2016 3:24 PM

## 2016-06-28 ENCOUNTER — Inpatient Hospital Stay: Payer: BLUE CROSS/BLUE SHIELD | Attending: Oncology

## 2016-06-28 ENCOUNTER — Inpatient Hospital Stay (HOSPITAL_BASED_OUTPATIENT_CLINIC_OR_DEPARTMENT_OTHER): Payer: BLUE CROSS/BLUE SHIELD | Admitting: Oncology

## 2016-06-28 VITALS — BP 161/91 | HR 114 | Temp 98.6°F | Resp 18 | Wt 220.0 lb

## 2016-06-28 DIAGNOSIS — Z800 Family history of malignant neoplasm of digestive organs: Secondary | ICD-10-CM

## 2016-06-28 DIAGNOSIS — Z87891 Personal history of nicotine dependence: Secondary | ICD-10-CM | POA: Insufficient documentation

## 2016-06-28 DIAGNOSIS — C8111 Nodular sclerosis classical Hodgkin lymphoma, lymph nodes of head, face, and neck: Secondary | ICD-10-CM | POA: Insufficient documentation

## 2016-06-28 DIAGNOSIS — Z79899 Other long term (current) drug therapy: Secondary | ICD-10-CM | POA: Diagnosis not present

## 2016-06-28 DIAGNOSIS — D709 Neutropenia, unspecified: Secondary | ICD-10-CM | POA: Insufficient documentation

## 2016-06-28 DIAGNOSIS — Z7689 Persons encountering health services in other specified circumstances: Secondary | ICD-10-CM

## 2016-06-28 DIAGNOSIS — F419 Anxiety disorder, unspecified: Secondary | ICD-10-CM | POA: Insufficient documentation

## 2016-06-28 DIAGNOSIS — Z5111 Encounter for antineoplastic chemotherapy: Secondary | ICD-10-CM | POA: Insufficient documentation

## 2016-06-28 LAB — COMPREHENSIVE METABOLIC PANEL
ALT: 78 U/L — ABNORMAL HIGH (ref 17–63)
AST: 44 U/L — ABNORMAL HIGH (ref 15–41)
Albumin: 4.3 g/dL (ref 3.5–5.0)
Alkaline Phosphatase: 99 U/L (ref 38–126)
Anion gap: 4 — ABNORMAL LOW (ref 5–15)
BUN: 12 mg/dL (ref 6–20)
CO2: 26 mmol/L (ref 22–32)
Calcium: 8.8 mg/dL — ABNORMAL LOW (ref 8.9–10.3)
Chloride: 107 mmol/L (ref 101–111)
Creatinine, Ser: 0.86 mg/dL (ref 0.61–1.24)
GFR calc Af Amer: >60 mL/min (ref >60)
GFR calc non Af Amer: >60 mL/min (ref >60)
Glucose, Bld: 122 mg/dL — ABNORMAL HIGH (ref 65–99)
Potassium: 3.5 mmol/L (ref 3.5–5.1)
Sodium: 137 mmol/L (ref 135–145)
Total Bilirubin: 0.5 mg/dL (ref 0.3–1.2)
Total Protein: 7.2 g/dL (ref 6.5–8.1)

## 2016-06-28 LAB — CBC WITH DIFFERENTIAL/PLATELET
Basophils Absolute: 0.1 10*3/uL (ref 0–0.1)
Basophils Relative: 4 %
Eosinophils Absolute: 0.2 10*3/uL (ref 0–0.7)
Eosinophils Relative: 8 %
HCT: 34.2 % — ABNORMAL LOW (ref 40.0–52.0)
Hemoglobin: 12 g/dL — ABNORMAL LOW (ref 13.0–18.0)
Lymphocytes Relative: 39 %
Lymphs Abs: 1 10*3/uL (ref 1.0–3.6)
MCH: 26.2 pg (ref 26.0–34.0)
MCHC: 35.1 g/dL (ref 32.0–36.0)
MCV: 74.7 fL — ABNORMAL LOW (ref 80.0–100.0)
Monocytes Absolute: 0.4 10*3/uL (ref 0.2–1.0)
Monocytes Relative: 16 %
Neutro Abs: 0.9 10*3/uL — ABNORMAL LOW (ref 1.4–6.5)
Neutrophils Relative %: 33 %
Platelets: 292 10*3/uL (ref 150–440)
RBC: 4.59 MIL/uL (ref 4.40–5.90)
RDW: 23.2 % — ABNORMAL HIGH (ref 11.5–14.5)
WBC: 2.6 10*3/uL — ABNORMAL LOW (ref 3.8–10.6)

## 2016-06-28 LAB — MAGNESIUM: Magnesium: 2.2 mg/dL (ref 1.7–2.4)

## 2016-06-28 NOTE — Progress Notes (Signed)
Patient offers no complaints today. 

## 2016-06-29 ENCOUNTER — Inpatient Hospital Stay: Payer: BLUE CROSS/BLUE SHIELD

## 2016-07-06 ENCOUNTER — Inpatient Hospital Stay: Payer: BLUE CROSS/BLUE SHIELD

## 2016-07-06 ENCOUNTER — Other Ambulatory Visit: Payer: Self-pay | Admitting: Hematology and Oncology

## 2016-07-06 VITALS — BP 142/88 | HR 76 | Temp 97.9°F | Resp 18

## 2016-07-06 DIAGNOSIS — C8111 Nodular sclerosis classical Hodgkin lymphoma, lymph nodes of head, face, and neck: Secondary | ICD-10-CM

## 2016-07-06 LAB — COMPREHENSIVE METABOLIC PANEL WITH GFR
ALT: 58 U/L (ref 17–63)
AST: 30 U/L (ref 15–41)
Albumin: 4.4 g/dL (ref 3.5–5.0)
Alkaline Phosphatase: 100 U/L (ref 38–126)
Anion gap: 6 (ref 5–15)
BUN: 20 mg/dL (ref 6–20)
CO2: 26 mmol/L (ref 22–32)
Calcium: 8.9 mg/dL (ref 8.9–10.3)
Chloride: 105 mmol/L (ref 101–111)
Creatinine, Ser: 0.86 mg/dL (ref 0.61–1.24)
GFR calc Af Amer: >60 mL/min
GFR calc non Af Amer: >60 mL/min
Glucose, Bld: 99 mg/dL (ref 65–99)
Potassium: 3.9 mmol/L (ref 3.5–5.1)
Sodium: 137 mmol/L (ref 135–145)
Total Bilirubin: 0.6 mg/dL (ref 0.3–1.2)
Total Protein: 7.7 g/dL (ref 6.5–8.1)

## 2016-07-06 LAB — CBC WITH DIFFERENTIAL/PLATELET
Basophils Absolute: 0.1 10*3/uL (ref 0–0.1)
Basophils Relative: 3 %
Eosinophils Absolute: 0.3 10*3/uL (ref 0–0.7)
Eosinophils Relative: 5 %
HCT: 36.9 % — ABNORMAL LOW (ref 40.0–52.0)
Hemoglobin: 12.7 g/dL — ABNORMAL LOW (ref 13.0–18.0)
Lymphocytes Relative: 28 %
Lymphs Abs: 1.5 10*3/uL (ref 1.0–3.6)
MCH: 26.3 pg (ref 26.0–34.0)
MCHC: 34.5 g/dL (ref 32.0–36.0)
MCV: 76.3 fL — ABNORMAL LOW (ref 80.0–100.0)
Monocytes Absolute: 0.8 10*3/uL (ref 0.2–1.0)
Monocytes Relative: 16 %
Neutro Abs: 2.6 10*3/uL (ref 1.4–6.5)
Neutrophils Relative %: 48 %
Platelets: 269 10*3/uL (ref 150–440)
RBC: 4.84 MIL/uL (ref 4.40–5.90)
RDW: 23.1 % — ABNORMAL HIGH (ref 11.5–14.5)
WBC: 5.3 10*3/uL (ref 3.8–10.6)

## 2016-07-06 MED ORDER — DOXORUBICIN HCL CHEMO IV INJECTION 2 MG/ML
25.0000 mg/m2 | Freq: Once | INTRAVENOUS | Status: AC
  Administered 2016-07-06: 54 mg via INTRAVENOUS
  Filled 2016-07-06: qty 27

## 2016-07-06 MED ORDER — SODIUM CHLORIDE 0.9 % IV SOLN
375.0000 mg/m2 | Freq: Once | INTRAVENOUS | Status: AC
  Administered 2016-07-06: 800 mg via INTRAVENOUS
  Filled 2016-07-06: qty 40

## 2016-07-06 MED ORDER — HEPARIN SOD (PORK) LOCK FLUSH 100 UNIT/ML IV SOLN
INTRAVENOUS | Status: AC
  Filled 2016-07-06: qty 5

## 2016-07-06 MED ORDER — FOSAPREPITANT DIMEGLUMINE INJECTION 150 MG
Freq: Once | INTRAVENOUS | Status: AC
  Administered 2016-07-06: 10:00:00 via INTRAVENOUS
  Filled 2016-07-06: qty 5

## 2016-07-06 MED ORDER — HEPARIN SOD (PORK) LOCK FLUSH 100 UNIT/ML IV SOLN
500.0000 [IU] | Freq: Once | INTRAVENOUS | Status: AC | PRN
  Administered 2016-07-06: 500 [IU]

## 2016-07-06 MED ORDER — PALONOSETRON HCL INJECTION 0.25 MG/5ML
0.2500 mg | Freq: Once | INTRAVENOUS | Status: AC
  Administered 2016-07-06: 0.25 mg via INTRAVENOUS
  Filled 2016-07-06: qty 5

## 2016-07-06 MED ORDER — SODIUM CHLORIDE 0.9 % IV SOLN
10.0000 [IU]/m2 | Freq: Once | INTRAVENOUS | Status: AC
  Administered 2016-07-06: 21 [IU] via INTRAVENOUS
  Filled 2016-07-06: qty 7

## 2016-07-06 MED ORDER — PEGFILGRASTIM 6 MG/0.6ML ~~LOC~~ PSKT
6.0000 mg | PREFILLED_SYRINGE | Freq: Once | SUBCUTANEOUS | Status: AC
  Administered 2016-07-06: 6 mg via SUBCUTANEOUS
  Filled 2016-07-06: qty 0.6

## 2016-07-06 MED ORDER — SODIUM CHLORIDE 0.9 % IV SOLN
Freq: Once | INTRAVENOUS | Status: AC
  Administered 2016-07-06: 10:00:00 via INTRAVENOUS
  Filled 2016-07-06: qty 1000

## 2016-07-06 MED ORDER — VINBLASTINE SULFATE CHEMO INJECTION 1 MG/ML
13.0000 mg | Freq: Once | INTRAVENOUS | Status: AC
  Administered 2016-07-06: 13 mg via INTRAVENOUS
  Filled 2016-07-06: qty 13

## 2016-07-18 NOTE — Progress Notes (Signed)
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 Swelling in his neck has significantly decreased and has essentially resolved. He currently feels well and is asymptomatic. He denies any fevers, night sweats, or weight loss. He has no neurologic complaints. He denies any dysphagia or difficulty swallowing. He has no chest pain or shortness of breath. He denies any nausea, vomiting, constipation, or diarrhea. He has no urinary complaints. Patient offers no specific complaints today.  REVIEW OF SYSTEMS:   Review of Systems  Constitutional: Negative.  Negative for fever, malaise/fatigue and weight loss.  HENT: Negative.   Respiratory: Negative.  Negative for cough and shortness of breath.   Cardiovascular: Negative.  Negative for chest pain and leg swelling.  Gastrointestinal: Negative.  Negative for abdominal pain.  Genitourinary: Negative.   Musculoskeletal: Negative.  Negative for neck pain.  Neurological: Negative.  Negative for weakness.  Psychiatric/Behavioral: The patient is not nervous/anxious.     As per HPI. Otherwise, a complete review of systems is negative.  PAST MEDICAL HISTORY: Past Medical History:  Diagnosis Date  . Medical history non-contributory     PAST SURGICAL HISTORY: Past Surgical History:  Procedure Laterality Date  . LYMPH NODE BIOPSY Right 04/13/2016   Procedure: biopsy of lymph nodes open deep cervical node (right);  Surgeon: Margaretha Sheffield, MD;  Location: [facility];  Service: ENT;  Laterality: Right;  . NO PAST SURGERIES    . PORTA CATH INSERTION N/A 05/15/2016   Procedure: Glori Luis Cath Insertion;  Surgeon: Katha Cabal, MD;  Location: [facility];  Service: Cardiovascular;  Laterality: N/A;    FAMILY HISTORY: Family History  Problem Relation Age of Onset  . Heart disease Father   . COPD Father   . Hypertension Father   . Hypertension Brother   . Alzheimer's disease Maternal Grandmother   . Pancreatic cancer Maternal Grandfather     ADVANCED DIRECTIVES (Y/N):  N  HEALTH MAINTENANCE: Social History  Substance Use Topics  . Smoking status: Former Research scientist (life sciences)  . Smokeless tobacco: Former Systems developer    Types: Snuff    Quit date: 05/25/2015     Comment: social in college  . Alcohol use 1.2 oz/week    2 Glasses of wine per week     Colonoscopy:  PAP:  Bone density:  Lipid panel:  No Known Allergies  Current Outpatient Prescriptions  Medication Sig Dispense Refill  . acetaminophen (TYLENOL) 325 MG tablet Take 650 mg by mouth every 6 (six) hours as needed for moderate pain or headache.    . lidocaine-prilocaine (EMLA) cream Apply to affected area once 30 g 3  . ondansetron (ZOFRAN) 8 MG tablet Take 1 tablet (8 mg total) by mouth 2 (two) times daily as needed. (Patient not taking: Reported on 07/19/2016) 30 tablet 1  . prochlorperazine (COMPAZINE) 10 MG tablet Take 1 tablet (10 mg total) by mouth every 6 (six) hours as needed (Nausea or vomiting). (Patient not taking: Reported on 07/19/2016) 30 tablet 1   Current Facility-Administered Medications  Medication Dose Route Frequency Provider Last Rate Last Dose  . ceFAZolin (ANCEF) IVPB 1 g/50 mL premix  1 g Intravenous Once  Katha Cabal, MD        OBJECTIVE: Vitals:   07/19/16 1433  BP: (!) 151/91  Pulse: (!) 109  Temp: 98.6 F (37 C)     Body mass index is 34.36 kg/m.    ECOG FS:0 - Asymptomatic  General: Well-developed, well-nourished, no acute distress. Eyes: Pink conjunctiva, anicteric sclera. HEENT: Large easily palpable right neck mass, nearly resolved. Lungs: Clear to  auscultation bilaterally. Heart: Regular rate and rhythm. No rubs, murmurs, or gallops. Abdomen: Soft, nontender, nondistended. No organomegaly noted, normoactive bowel sounds. Musculoskeletal: No edema, cyanosis, or clubbing. Neuro: Alert, answering all questions appropriately. Cranial nerves grossly intact. Skin: No rashes or petechiae noted. Psych: Normal affect.   LAB RESULTS:  Lab Results  Component Value Date   NA 137 07/19/2016   K 3.5 07/19/2016   CL 104 07/19/2016   CO2 24 07/19/2016   GLUCOSE 122 (H) 07/19/2016   BUN 14 07/19/2016   CREATININE 0.84 07/19/2016   CALCIUM 9.3 07/19/2016   PROT 7.6 07/19/2016   ALBUMIN 4.5 07/19/2016   AST 31 07/19/2016   ALT 47 07/19/2016   ALKPHOS 132 (H) 07/19/2016   BILITOT 0.7 07/19/2016   GFRNONAA >60 07/19/2016   GFRAA >60 07/19/2016    Lab Results  Component Value Date   WBC 12.8 (H) 07/19/2016   NEUTROABS 10.0 (H) 07/19/2016   HGB 12.5 (L) 07/19/2016   HCT 35.4 (L) 07/19/2016   MCV 76.3 (L) 07/19/2016   PLT 154 07/19/2016     STUDIES: No results found.  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 Bone marrow biopsy was negative. MUGA scan and PFTs are adequate to proceed with treatment. Because of patient's persistent neutropenia, add OnPro Neulasta support as needed for the remainder of the cycles. Patient will receive treatment on days 1 and 15 for a total of 6 cycles, provided his blood counts tolerate this schedule. Proceed with cycle 3, day 1 tomorrow and in 2 weeks for consideration of cycle 3, day 15. 2. Neutropenia: Resolved. Patient has requested to hold Neulasta this cycle given side effects he had with his most recent injection. He expressed understanding that he will likely need treatment with Neulasta on subsequent  cycles.  Approximately 30 minutes was spent in discussion of which greater than 50% was consultation.  Patient expressed understanding and was in agreement with this plan. He also understands that He can call clinic at any time with any questions, concerns, or complaints.   Cancer Staging Nodular sclerosis Hodgkin lymphoma of lymph nodes of neck (HCC) Staging form: Hodgkin and Non-Hodgkin Lymphoma, AJCC 8th Edition - Clinical stage from 05/09/2016: Stage III (Hodgkin lymphoma, A - Asymptomatic) - Signed by Lloyd Huger, MD on 05/09/2016   Lloyd Huger, MD   07/21/2016 6:46 PM

## 2016-07-19 ENCOUNTER — Inpatient Hospital Stay (HOSPITAL_BASED_OUTPATIENT_CLINIC_OR_DEPARTMENT_OTHER): Payer: BLUE CROSS/BLUE SHIELD | Admitting: Oncology

## 2016-07-19 ENCOUNTER — Inpatient Hospital Stay: Payer: BLUE CROSS/BLUE SHIELD

## 2016-07-19 VITALS — BP 151/91 | HR 109 | Temp 98.6°F | Wt 226.0 lb

## 2016-07-19 DIAGNOSIS — C8111 Nodular sclerosis classical Hodgkin lymphoma, lymph nodes of head, face, and neck: Secondary | ICD-10-CM

## 2016-07-19 DIAGNOSIS — Z800 Family history of malignant neoplasm of digestive organs: Secondary | ICD-10-CM | POA: Diagnosis not present

## 2016-07-19 DIAGNOSIS — Z79899 Other long term (current) drug therapy: Secondary | ICD-10-CM

## 2016-07-19 DIAGNOSIS — F419 Anxiety disorder, unspecified: Secondary | ICD-10-CM | POA: Diagnosis not present

## 2016-07-19 DIAGNOSIS — Z87891 Personal history of nicotine dependence: Secondary | ICD-10-CM | POA: Diagnosis not present

## 2016-07-19 LAB — COMPREHENSIVE METABOLIC PANEL WITH GFR
ALT: 47 U/L (ref 17–63)
AST: 31 U/L (ref 15–41)
Albumin: 4.5 g/dL (ref 3.5–5.0)
Alkaline Phosphatase: 132 U/L — ABNORMAL HIGH (ref 38–126)
Anion gap: 9 (ref 5–15)
BUN: 14 mg/dL (ref 6–20)
CO2: 24 mmol/L (ref 22–32)
Calcium: 9.3 mg/dL (ref 8.9–10.3)
Chloride: 104 mmol/L (ref 101–111)
Creatinine, Ser: 0.84 mg/dL (ref 0.61–1.24)
GFR calc Af Amer: >60 mL/min
GFR calc non Af Amer: >60 mL/min
Glucose, Bld: 122 mg/dL — ABNORMAL HIGH (ref 65–99)
Potassium: 3.5 mmol/L (ref 3.5–5.1)
Sodium: 137 mmol/L (ref 135–145)
Total Bilirubin: 0.7 mg/dL (ref 0.3–1.2)
Total Protein: 7.6 g/dL (ref 6.5–8.1)

## 2016-07-19 LAB — CBC WITH DIFFERENTIAL/PLATELET
BASOS ABS: 0.1 10*3/uL (ref 0–0.1)
BASOS PCT: 1 %
EOS ABS: 0.3 10*3/uL (ref 0–0.7)
Eosinophils Relative: 2 %
HCT: 35.4 % — ABNORMAL LOW (ref 40.0–52.0)
HEMOGLOBIN: 12.5 g/dL — AB (ref 13.0–18.0)
Lymphocytes Relative: 13 %
Lymphs Abs: 1.6 10*3/uL (ref 1.0–3.6)
MCH: 26.8 pg (ref 26.0–34.0)
MCHC: 35.2 g/dL (ref 32.0–36.0)
MCV: 76.3 fL — ABNORMAL LOW (ref 80.0–100.0)
Monocytes Absolute: 0.7 10*3/uL (ref 0.2–1.0)
Monocytes Relative: 5 %
NEUTROS ABS: 10 10*3/uL — AB (ref 1.4–6.5)
NEUTROS PCT: 79 %
Platelets: 154 10*3/uL (ref 150–440)
RBC: 4.64 MIL/uL (ref 4.40–5.90)
RDW: 23.4 % — ABNORMAL HIGH (ref 11.5–14.5)
WBC: 12.8 10*3/uL — AB (ref 3.8–10.6)

## 2016-07-19 NOTE — Progress Notes (Signed)
Patient here today for follow up.  Patient states no new concerns today  

## 2016-07-20 ENCOUNTER — Inpatient Hospital Stay: Payer: BLUE CROSS/BLUE SHIELD

## 2016-07-20 DIAGNOSIS — C8111 Nodular sclerosis classical Hodgkin lymphoma, lymph nodes of head, face, and neck: Secondary | ICD-10-CM

## 2016-07-20 MED ORDER — SODIUM CHLORIDE 0.9 % IV SOLN
Freq: Once | INTRAVENOUS | Status: AC
  Administered 2016-07-20: 09:00:00 via INTRAVENOUS
  Filled 2016-07-20: qty 1000

## 2016-07-20 MED ORDER — DOXORUBICIN HCL CHEMO IV INJECTION 2 MG/ML
25.0000 mg/m2 | Freq: Once | INTRAVENOUS | Status: AC
  Administered 2016-07-20: 54 mg via INTRAVENOUS
  Filled 2016-07-20: qty 27

## 2016-07-20 MED ORDER — HEPARIN SOD (PORK) LOCK FLUSH 100 UNIT/ML IV SOLN
500.0000 [IU] | Freq: Once | INTRAVENOUS | Status: AC | PRN
  Administered 2016-07-20: 500 [IU]
  Filled 2016-07-20: qty 5

## 2016-07-20 MED ORDER — PEGFILGRASTIM 6 MG/0.6ML ~~LOC~~ PSKT: 6.0000 mg | PREFILLED_SYRINGE | Freq: Once | SUBCUTANEOUS | Status: DC

## 2016-07-20 MED ORDER — SODIUM CHLORIDE 0.9 % IV SOLN
13.0000 mg | Freq: Once | INTRAVENOUS | Status: AC
  Administered 2016-07-20: 13 mg via INTRAVENOUS
  Filled 2016-07-20: qty 13

## 2016-07-20 MED ORDER — SODIUM CHLORIDE 0.9 % IV SOLN
375.0000 mg/m2 | Freq: Once | INTRAVENOUS | Status: AC
  Administered 2016-07-20: 800 mg via INTRAVENOUS
  Filled 2016-07-20: qty 40

## 2016-07-20 MED ORDER — SODIUM CHLORIDE 0.9% FLUSH
10.0000 mL | INTRAVENOUS | Status: DC | PRN
  Administered 2016-07-20: 10 mL
  Filled 2016-07-20: qty 10

## 2016-07-20 MED ORDER — PALONOSETRON HCL INJECTION 0.25 MG/5ML
0.2500 mg | Freq: Once | INTRAVENOUS | Status: AC
  Administered 2016-07-20: 0.25 mg via INTRAVENOUS
  Filled 2016-07-20: qty 5

## 2016-07-20 MED ORDER — SODIUM CHLORIDE 0.9 % IV SOLN
Freq: Once | INTRAVENOUS | Status: AC
  Administered 2016-07-20: 09:00:00 via INTRAVENOUS
  Filled 2016-07-20: qty 5

## 2016-07-20 MED ORDER — SODIUM CHLORIDE 0.9 % IV SOLN
10.0000 [IU]/m2 | Freq: Once | INTRAVENOUS | Status: AC
  Administered 2016-07-20: 21 [IU] via INTRAVENOUS
  Filled 2016-07-20: qty 7

## 2016-07-31 ENCOUNTER — Telehealth: Payer: Self-pay

## 2016-07-31 DIAGNOSIS — C8111 Nodular sclerosis classical Hodgkin lymphoma, lymph nodes of head, face, and neck: Secondary | ICD-10-CM

## 2016-07-31 NOTE — Telephone Encounter (Signed)
Patient would like to know if he can take tylenol PM? He is not sleeping well.

## 2016-07-31 NOTE — Telephone Encounter (Signed)
Patient made aware of this and will see [place] thursday

## 2016-07-31 NOTE — Telephone Encounter (Signed)
Yes, that's fine 

## 2016-08-01 NOTE — Progress Notes (Signed)
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 Swelling in his neck has significantly decreased and has essentially resolved. He has noticed a mild peripheral neuropathy that does not affect her day-to-day activity. He otherwise feels well and is asymptomatic. He denies any fevers, night sweats, or weight loss. He has no other neurologic complaints. He denies any dysphagia or difficulty swallowing. He has no chest pain or shortness of breath. He denies any nausea, vomiting, constipation, or diarrhea. He has no urinary complaints. Patient offers no further specific complaints today.  REVIEW OF SYSTEMS:   Review of Systems  Constitutional: Negative.  Negative for fever, malaise/fatigue and weight loss.  HENT: Negative.   Respiratory: Negative.  Negative for cough and shortness of breath.   Cardiovascular: Negative.  Negative for chest pain and leg swelling.  Gastrointestinal: Negative.  Negative for abdominal pain.  Genitourinary: Negative.   Musculoskeletal: Negative.  Negative for neck pain.  Skin: Negative.  Negative for rash.  Neurological: Positive for sensory change. Negative for weakness.  Psychiatric/Behavioral: Negative.  The patient is not nervous/anxious.     As per HPI. Otherwise, a complete review of systems is negative.  PAST MEDICAL HISTORY: Past Medical History:  Diagnosis Date  . Medical history non-contributory     PAST SURGICAL HISTORY: Past Surgical History:  Procedure Laterality Date  . LYMPH NODE BIOPSY Right 04/13/2016   Procedure: biopsy of lymph nodes open deep cervical node  (right);  Surgeon: Margaretha Sheffield, MD;  Location: [place];  Service: ENT;  Laterality: Right;  . NO PAST SURGERIES    . PORTA CATH INSERTION N/A 05/15/2016   Procedure: Glori Luis Cath Insertion;  Surgeon: Katha Cabal, MD;  Location: [place] [facility];  Service: Cardiovascular;  Laterality: N/A;    FAMILY HISTORY: Family History  Problem Relation Age of Onset  . Heart disease Father   . COPD Father   . Hypertension Father   . Hypertension Brother   . Alzheimer's disease Maternal Grandmother   . Pancreatic cancer Maternal Grandfather     ADVANCED DIRECTIVES (Y/N):  N  HEALTH MAINTENANCE: Social History  Substance Use Topics  . Smoking status: Former Research scientist (life sciences)  . Smokeless tobacco: Former Systems developer    Types: Snuff    Quit date: 05/25/2015     Comment: social in college  . Alcohol use 1.2 oz/week    2 Glasses of wine per week     Colonoscopy:  PAP:  Bone density:  Lipid panel:  No Known Allergies  Current Outpatient Prescriptions  Medication Sig Dispense Refill  . acetaminophen (TYLENOL) 325 MG tablet Take 650 mg by mouth every 6 (six) hours as needed for moderate pain or headache.    . lidocaine-prilocaine (EMLA) cream Apply to affected area once 30 g 3  . ondansetron (ZOFRAN) 8 MG tablet Take 1 tablet (8 mg total) by mouth 2 (two) times daily as needed. (Patient not taking: Reported on 07/19/2016) 30 tablet 1  . prochlorperazine (COMPAZINE) 10 MG tablet Take 1 tablet (10 mg total) by mouth every 6 (six) hours as needed (Nausea or vomiting). (Patient not taking: Reported on 07/19/2016) 30 tablet 1  Current Facility-Administered Medications  Medication Dose Route Frequency Provider Last Rate Last Dose  . ceFAZolin (ANCEF) IVPB 1 g/50 mL premix  1 g Intravenous Once Schnier, Dolores Lory, MD        OBJECTIVE: Vitals:   08/02/16 1507  BP: (!) 161/82  Pulse: (!) 108  Resp: 16  Temp: 98.6 F (37 C)     Body mass index is 34.21 kg/m.    ECOG FS:0 -  Asymptomatic  General: Well-developed, well-nourished, no acute distress. Eyes: Pink conjunctiva, anicteric sclera. HEENT: Large easily palpable right neck mass, nearly resolved. Lungs: Clear to auscultation bilaterally. Heart: Regular rate and rhythm. No rubs, murmurs, or gallops. Abdomen: Soft, nontender, nondistended. No organomegaly noted, normoactive bowel sounds. Musculoskeletal: No edema, cyanosis, or clubbing. Neuro: Alert, answering all questions appropriately. Cranial nerves grossly intact. Skin: No rashes or petechiae noted. Psych: Normal affect.   LAB RESULTS:  Lab Results  Component Value Date   NA 136 08/02/2016   K 3.6 08/02/2016   CL 106 08/02/2016   CO2 23 08/02/2016   GLUCOSE 105 (H) 08/02/2016   BUN 19 08/02/2016   CREATININE 0.92 08/02/2016   CALCIUM 9.0 08/02/2016   PROT 7.5 08/02/2016   ALBUMIN 4.5 08/02/2016   AST 40 08/02/2016   ALT 55 08/02/2016   ALKPHOS 93 08/02/2016   BILITOT 0.4 08/02/2016   GFRNONAA >60 08/02/2016   GFRAA >60 08/02/2016    Lab Results  Component Value Date   WBC 3.3 (L) 08/02/2016   NEUTROABS 1.5 08/02/2016   HGB 12.8 (L) 08/02/2016   HCT 35.4 (L) 08/02/2016   MCV 78.4 (L) 08/02/2016   PLT 320 08/02/2016     STUDIES: No results found.  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 Bone marrow biopsy was negative. MUGA scan and PFTs are adequate to proceed with treatment. Patient appears to only need Neulasta on day 15 of every treatment. Patient will receive treatment on days 1 and 15 for a total of 6 cycles, provided his blood counts tolerate this schedule. Proceed with cycle 3, day 15 tomorrow and in 2 weeks for consideration of cycle 4, day 1. Finally, patient will return to clinic in 4 weeks for further evaluation and  consideration of cycle 4, day 15. 2. Neutropenia: Resolved. Patient has requested limit Neulasta use secondary to side effects, therefore likely will only receive treatment on the day 15 of each cycle.   3. Peripheral neuropathy: Mild, monitor.  Approximately 30 minutes was spent in discussion of which greater than 50% was consultation.  Patient expressed understanding and was in agreement with this plan. He also understands that He can call clinic at any time with any questions, concerns, or complaints.   Cancer Staging Nodular sclerosis Hodgkin lymphoma of lymph nodes of neck (HCC) Staging form: Hodgkin and Non-Hodgkin Lymphoma, AJCC 8th Edition - Clinical stage from 05/09/2016: Stage III (Hodgkin lymphoma, A - Asymptomatic) - Signed by Lloyd Huger, MD on 05/09/2016   Lloyd Huger, MD   08/02/2016 4:52 PM

## 2016-08-02 ENCOUNTER — Inpatient Hospital Stay (HOSPITAL_BASED_OUTPATIENT_CLINIC_OR_DEPARTMENT_OTHER): Payer: BLUE CROSS/BLUE SHIELD | Admitting: Oncology

## 2016-08-02 ENCOUNTER — Inpatient Hospital Stay: Payer: BLUE CROSS/BLUE SHIELD | Attending: Oncology

## 2016-08-02 VITALS — BP 161/82 | HR 108 | Temp 98.6°F | Resp 16 | Wt 225.0 lb

## 2016-08-02 DIAGNOSIS — Z800 Family history of malignant neoplasm of digestive organs: Secondary | ICD-10-CM | POA: Diagnosis not present

## 2016-08-02 DIAGNOSIS — Z87891 Personal history of nicotine dependence: Secondary | ICD-10-CM

## 2016-08-02 DIAGNOSIS — Z7689 Persons encountering health services in other specified circumstances: Secondary | ICD-10-CM

## 2016-08-02 DIAGNOSIS — C8111 Nodular sclerosis classical Hodgkin lymphoma, lymph nodes of head, face, and neck: Secondary | ICD-10-CM | POA: Diagnosis present

## 2016-08-02 DIAGNOSIS — G629 Polyneuropathy, unspecified: Secondary | ICD-10-CM | POA: Insufficient documentation

## 2016-08-02 DIAGNOSIS — Z5111 Encounter for antineoplastic chemotherapy: Secondary | ICD-10-CM | POA: Diagnosis not present

## 2016-08-02 LAB — CBC WITH DIFFERENTIAL/PLATELET
Basophils Absolute: 0.1 10*3/uL (ref 0–0.1)
Basophils Relative: 3 %
EOS PCT: 7 %
Eosinophils Absolute: 0.2 10*3/uL (ref 0–0.7)
HCT: 35.4 % — ABNORMAL LOW (ref 40.0–52.0)
Hemoglobin: 12.8 g/dL — ABNORMAL LOW (ref 13.0–18.0)
LYMPHS ABS: 1 10*3/uL (ref 1.0–3.6)
LYMPHS PCT: 30 %
MCH: 28.3 pg (ref 26.0–34.0)
MCHC: 36.1 g/dL — ABNORMAL HIGH (ref 32.0–36.0)
MCV: 78.4 fL — AB (ref 80.0–100.0)
MONOS PCT: 16 %
Monocytes Absolute: 0.5 10*3/uL (ref 0.2–1.0)
Neutro Abs: 1.5 10*3/uL (ref 1.4–6.5)
Neutrophils Relative %: 44 %
PLATELETS: 320 10*3/uL (ref 150–440)
RBC: 4.51 MIL/uL (ref 4.40–5.90)
RDW: 24.4 % — ABNORMAL HIGH (ref 11.5–14.5)
WBC: 3.3 10*3/uL — AB (ref 3.8–10.6)

## 2016-08-02 LAB — COMPREHENSIVE METABOLIC PANEL
ALK PHOS: 93 U/L (ref 38–126)
ALT: 55 U/L (ref 17–63)
AST: 40 U/L (ref 15–41)
Albumin: 4.5 g/dL (ref 3.5–5.0)
Anion gap: 7 (ref 5–15)
BUN: 19 mg/dL (ref 6–20)
CALCIUM: 9 mg/dL (ref 8.9–10.3)
CHLORIDE: 106 mmol/L (ref 101–111)
CO2: 23 mmol/L (ref 22–32)
CREATININE: 0.92 mg/dL (ref 0.61–1.24)
Glucose, Bld: 105 mg/dL — ABNORMAL HIGH (ref 65–99)
Potassium: 3.6 mmol/L (ref 3.5–5.1)
Sodium: 136 mmol/L (ref 135–145)
Total Bilirubin: 0.4 mg/dL (ref 0.3–1.2)
Total Protein: 7.5 g/dL (ref 6.5–8.1)

## 2016-08-02 NOTE — Progress Notes (Signed)
Patient here today for follow up.   

## 2016-08-03 ENCOUNTER — Inpatient Hospital Stay: Payer: BLUE CROSS/BLUE SHIELD

## 2016-08-03 VITALS — BP 137/90 | HR 77 | Temp 98.1°F | Resp 20

## 2016-08-03 DIAGNOSIS — C8111 Nodular sclerosis classical Hodgkin lymphoma, lymph nodes of head, face, and neck: Secondary | ICD-10-CM

## 2016-08-03 MED ORDER — SODIUM CHLORIDE 0.9 % IV SOLN
Freq: Once | INTRAVENOUS | Status: AC
  Administered 2016-08-03: 10:00:00 via INTRAVENOUS
  Filled 2016-08-03: qty 5

## 2016-08-03 MED ORDER — SODIUM CHLORIDE 0.9% FLUSH
10.0000 mL | INTRAVENOUS | Status: DC | PRN
  Administered 2016-08-03: 10 mL
  Filled 2016-08-03: qty 10

## 2016-08-03 MED ORDER — HEPARIN SOD (PORK) LOCK FLUSH 100 UNIT/ML IV SOLN
500.0000 [IU] | Freq: Once | INTRAVENOUS | Status: AC | PRN
  Administered 2016-08-03: 500 [IU]
  Filled 2016-08-03: qty 5

## 2016-08-03 MED ORDER — PEGFILGRASTIM 6 MG/0.6ML ~~LOC~~ PSKT
6.0000 mg | PREFILLED_SYRINGE | Freq: Once | SUBCUTANEOUS | Status: AC
  Administered 2016-08-03: 6 mg via SUBCUTANEOUS
  Filled 2016-08-03: qty 0.6

## 2016-08-03 MED ORDER — VINBLASTINE SULFATE CHEMO INJECTION 1 MG/ML
13.0000 mg | Freq: Once | INTRAVENOUS | Status: AC
  Administered 2016-08-03: 13 mg via INTRAVENOUS
  Filled 2016-08-03: qty 13

## 2016-08-03 MED ORDER — SODIUM CHLORIDE 0.9 % IV SOLN
375.0000 mg/m2 | Freq: Once | INTRAVENOUS | Status: AC
  Administered 2016-08-03: 800 mg via INTRAVENOUS
  Filled 2016-08-03: qty 40

## 2016-08-03 MED ORDER — DOXORUBICIN HCL CHEMO IV INJECTION 2 MG/ML
25.0000 mg/m2 | Freq: Once | INTRAVENOUS | Status: AC
  Administered 2016-08-03: 54 mg via INTRAVENOUS
  Filled 2016-08-03: qty 27

## 2016-08-03 MED ORDER — SODIUM CHLORIDE 0.9 % IV SOLN
10.0000 [IU]/m2 | Freq: Once | INTRAVENOUS | Status: AC
  Administered 2016-08-03: 21 [IU] via INTRAVENOUS
  Filled 2016-08-03: qty 7

## 2016-08-03 MED ORDER — SODIUM CHLORIDE 0.9 % IV SOLN
Freq: Once | INTRAVENOUS | Status: AC
  Administered 2016-08-03: 09:00:00 via INTRAVENOUS
  Filled 2016-08-03: qty 1000

## 2016-08-07 ENCOUNTER — Ambulatory Visit
Admission: RE | Admit: 2016-08-07 | Discharge: 2016-08-07 | Disposition: A | Discharge status: Home / Self Care | Payer: BLUE CROSS/BLUE SHIELD | Source: Ambulatory Visit | Attending: Oncology | Admitting: Oncology

## 2016-08-07 DIAGNOSIS — C8111 Nodular sclerosis classical Hodgkin lymphoma, lymph nodes of head, face, and neck: Secondary | ICD-10-CM | POA: Diagnosis present

## 2016-08-07 MED ORDER — TECHNETIUM TC 99M-LABELED RED BLOOD CELLS IV KIT
22.2400 | PACK | Freq: Once | INTRAVENOUS | Status: AC | PRN
  Administered 2016-08-07: 22.24 via INTRAVENOUS

## 2016-08-17 ENCOUNTER — Inpatient Hospital Stay: Payer: BLUE CROSS/BLUE SHIELD

## 2016-08-17 VITALS — BP 131/75 | HR 81 | Temp 98.6°F | Resp 18

## 2016-08-17 DIAGNOSIS — C8111 Nodular sclerosis classical Hodgkin lymphoma, lymph nodes of head, face, and neck: Secondary | ICD-10-CM

## 2016-08-17 LAB — CBC WITH DIFFERENTIAL/PLATELET
BASOS ABS: 0.1 10*3/uL (ref 0–0.1)
BASOS PCT: 1 %
EOS ABS: 0.2 10*3/uL (ref 0–0.7)
EOS PCT: 2 %
HCT: 33.8 % — ABNORMAL LOW (ref 40.0–52.0)
Hemoglobin: 11.8 g/dL — ABNORMAL LOW (ref 13.0–18.0)
Lymphocytes Relative: 14 %
Lymphs Abs: 1.5 10*3/uL (ref 1.0–3.6)
MCH: 27.9 pg (ref 26.0–34.0)
MCHC: 34.8 g/dL (ref 32.0–36.0)
MCV: 80.1 fL (ref 80.0–100.0)
MONO ABS: 0.9 10*3/uL (ref 0.2–1.0)
Monocytes Relative: 8 %
Neutro Abs: 8.3 10*3/uL — ABNORMAL HIGH (ref 1.4–6.5)
Neutrophils Relative %: 75 %
PLATELETS: 121 10*3/uL — AB (ref 150–440)
RBC: 4.22 MIL/uL — ABNORMAL LOW (ref 4.40–5.90)
RDW: 23.3 % — AB (ref 11.5–14.5)
WBC: 11.1 10*3/uL — ABNORMAL HIGH (ref 3.8–10.6)

## 2016-08-17 LAB — COMPREHENSIVE METABOLIC PANEL
ALBUMIN: 4.2 g/dL (ref 3.5–5.0)
ALT: 45 U/L (ref 17–63)
ANION GAP: 5 (ref 5–15)
AST: 29 U/L (ref 15–41)
Alkaline Phosphatase: 110 U/L (ref 38–126)
BUN: 20 mg/dL (ref 6–20)
CHLORIDE: 106 mmol/L (ref 101–111)
CO2: 25 mmol/L (ref 22–32)
Calcium: 8.7 mg/dL — ABNORMAL LOW (ref 8.9–10.3)
Creatinine, Ser: 0.87 mg/dL (ref 0.61–1.24)
GFR calc Af Amer: >60 mL/min (ref >60)
GFR calc non Af Amer: >60 mL/min (ref >60)
GLUCOSE: 102 mg/dL — AB (ref 65–99)
POTASSIUM: 4 mmol/L (ref 3.5–5.1)
Sodium: 136 mmol/L (ref 135–145)
Total Bilirubin: 0.7 mg/dL (ref 0.3–1.2)
Total Protein: 7.4 g/dL (ref 6.5–8.1)

## 2016-08-17 MED ORDER — SODIUM CHLORIDE 0.9 % IV SOLN
Freq: Once | INTRAVENOUS | Status: AC
  Administered 2016-08-17: 10:00:00 via INTRAVENOUS
  Filled 2016-08-17: qty 5

## 2016-08-17 MED ORDER — SODIUM CHLORIDE 0.9 % IV SOLN
10.0000 [IU]/m2 | Freq: Once | INTRAVENOUS | Status: AC
  Administered 2016-08-17: 21 [IU] via INTRAVENOUS
  Filled 2016-08-17: qty 7

## 2016-08-17 MED ORDER — SODIUM CHLORIDE 0.9% FLUSH
10.0000 mL | INTRAVENOUS | Status: DC | PRN
Start: 2016-08-17 — End: 2016-08-17
  Administered 2016-08-17: 10 mL via INTRAVENOUS
  Filled 2016-08-17: qty 10

## 2016-08-17 MED ORDER — SODIUM CHLORIDE 0.9 % IV SOLN
375.0000 mg/m2 | Freq: Once | INTRAVENOUS | Status: AC
  Administered 2016-08-17: 800 mg via INTRAVENOUS
  Filled 2016-08-17: qty 40

## 2016-08-17 MED ORDER — HEPARIN SOD (PORK) LOCK FLUSH 100 UNIT/ML IV SOLN
500.0000 [IU] | Freq: Once | INTRAVENOUS | Status: AC
  Administered 2016-08-17: 500 [IU] via INTRAVENOUS

## 2016-08-17 MED ORDER — SODIUM CHLORIDE 0.9% FLUSH
10.0000 mL | INTRAVENOUS | Status: DC | PRN
Start: 2016-08-17 — End: 2016-08-17
  Filled 2016-08-17: qty 10

## 2016-08-17 MED ORDER — HEPARIN SOD (PORK) LOCK FLUSH 100 UNIT/ML IV SOLN
INTRAVENOUS | Status: AC
  Filled 2016-08-17: qty 5

## 2016-08-17 MED ORDER — SODIUM CHLORIDE 0.9 % IV SOLN
Freq: Once | INTRAVENOUS | Status: AC
  Administered 2016-08-17: 10:00:00 via INTRAVENOUS
  Filled 2016-08-17: qty 1000

## 2016-08-17 MED ORDER — PALONOSETRON HCL INJECTION 0.25 MG/5ML
0.2500 mg | Freq: Once | INTRAVENOUS | Status: AC
  Administered 2016-08-17: 0.25 mg via INTRAVENOUS
  Filled 2016-08-17: qty 5

## 2016-08-17 MED ORDER — DOXORUBICIN HCL CHEMO IV INJECTION 2 MG/ML
25.0000 mg/m2 | Freq: Once | INTRAVENOUS | Status: AC
  Administered 2016-08-17: 54 mg via INTRAVENOUS
  Filled 2016-08-17: qty 27

## 2016-08-17 MED ORDER — VINBLASTINE SULFATE CHEMO INJECTION 1 MG/ML
13.0000 mg | Freq: Once | INTRAVENOUS | Status: AC
  Administered 2016-08-17: 13 mg via INTRAVENOUS
  Filled 2016-08-17: qty 13

## 2016-08-17 NOTE — Progress Notes (Signed)
Patient stated that Dr. Gary Fleet goal was to give him Neulasta OnBody every other treatment and requested I check on that.  Dr. Rogue Bussing is on call, in Dr. Gary Fleet absence, and I spoke with him and he recommended that the patient Not receive the Neulasta today, particularly with his chemo regimen.

## 2016-08-21 MED ORDER — LIDOCAINE-PRILOCAINE 2.5-2.5 % EX CREA: TOPICAL_CREAM | CUTANEOUS | 3 refills | Status: DC

## 2016-08-21 NOTE — Telephone Encounter (Signed)
-----   Message from Lloyd Huger, MD sent at 08/18/2016  7:19 AM EDT ----- Regarding: FW: refill request   ----- Message ----- From: Livia Snellen, RN Sent: 08/17/2016   9:33 AM To: Lloyd Huger, MD Subject: refill request                                 Please send refill for EMLA cream to patient's pharmacy. Thanks, [facility]

## 2016-08-28 NOTE — Progress Notes (Signed)
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 The swelling in his neck has essentially resolved. He has noticed a mild peripheral neuropathy that does not affect his day-to-day activity. He otherwise feels well and is asymptomatic. He denies any fevers, night sweats, or weight loss. He has no other neurologic complaints. He denies any dysphagia or difficulty swallowing. He has no chest pain or shortness of breath. He denies any nausea, vomiting, constipation, or diarrhea. He has no urinary complaints. Patient offers no further specific complaints today.  REVIEW OF SYSTEMS:   Review of Systems  Constitutional: Negative.  Negative for fever, malaise/fatigue and weight loss.  HENT: Negative.   Respiratory: Negative.  Negative for cough and shortness of breath.   Cardiovascular: Negative.  Negative for chest pain and leg swelling.  Gastrointestinal: Negative.  Negative for abdominal pain.  Genitourinary: Negative.   Musculoskeletal: Negative.  Negative for neck pain.  Skin: Negative.  Negative for rash.  Neurological: Positive for sensory change. Negative for weakness.  Psychiatric/Behavioral: Negative.  The patient is not nervous/anxious.     As per HPI. Otherwise, a complete review of systems is negative.  PAST MEDICAL HISTORY: Past Medical History:  Diagnosis Date  . Medical history non-contributory     PAST SURGICAL HISTORY: Past Surgical History:  Procedure Laterality Date  . LYMPH NODE BIOPSY Right 04/13/2016   Procedure: biopsy of lymph nodes open deep cervical node (right);  Surgeon: Margaretha Sheffield, MD;  Location: [place];  Service: ENT;  Laterality: Right;  . NO PAST SURGERIES    . PORTA CATH INSERTION N/A 05/15/2016   Procedure: Glori Luis Cath Insertion;  Surgeon: Katha Cabal, MD;  Location: [place] [facility];  Service: Cardiovascular;  Laterality: N/A;    FAMILY HISTORY: Family History  Problem Relation Age of Onset  . Heart disease Father   . COPD Father   . Hypertension Father   . Hypertension Brother   . Alzheimer's disease Maternal Grandmother   . Pancreatic cancer Maternal Grandfather     ADVANCED DIRECTIVES (Y/N):  N  HEALTH MAINTENANCE: Social History  Substance Use Topics  . Smoking status: Former Research scientist (life sciences)  . Smokeless tobacco: Former Systems developer    Types: Snuff    Quit date: 05/25/2015     Comment: social in college  . Alcohol use 1.2 oz/week    2 Glasses of wine per week     Colonoscopy:  PAP:  Bone density:  Lipid panel:  No Known Allergies  Current Outpatient Prescriptions  Medication Sig Dispense Refill  . acetaminophen (TYLENOL) 325 MG tablet Take 650 mg by mouth every 6 (six) hours as needed for moderate pain or headache.    . lidocaine-prilocaine (EMLA) cream Apply to affected area once 30 g 3  . ondansetron (ZOFRAN) 8 MG tablet Take 1 tablet (8 mg total) by mouth 2 (two) times daily as needed. 30 tablet 1  . prochlorperazine (COMPAZINE) 10 MG tablet Take 1 tablet (10 mg total) by mouth every 6 (six) hours as needed (Nausea or vomiting). 30 tablet 1   Current Facility-Administered Medications  Medication Dose Route Frequency Provider Last Rate Last Dose  .  ceFAZolin (ANCEF) IVPB 1 g/50 mL premix  1 g Intravenous Once Schnier, Dolores Lory, MD        OBJECTIVE: Vitals:   08/29/16 1016  BP: (!) 149/90  Pulse: 88  Resp: 20  Temp: (!) 96.8 F (36 C)     Body mass index is 34.17 kg/m.    ECOG FS:0 - Asymptomatic  General: Well-developed, well-nourished, no acute distress. Eyes: Pink conjunctiva, anicteric sclera. HEENT:  Large easily palpable right neck mass, nearly resolved. Lungs: Clear to auscultation bilaterally. Heart: Regular rate and rhythm. No rubs, murmurs, or gallops. Abdomen: Soft, nontender, nondistended. No organomegaly noted, normoactive bowel sounds. Musculoskeletal: No edema, cyanosis, or clubbing. Neuro: Alert, answering all questions appropriately. Cranial nerves grossly intact. Skin: No rashes or petechiae noted. Psych: Normal affect.   LAB RESULTS:  Lab Results  Component Value Date   NA 136 08/29/2016   K 3.9 08/29/2016   CL 107 08/29/2016   CO2 23 08/29/2016   GLUCOSE 106 (H) 08/29/2016   BUN 16 08/29/2016   CREATININE 0.83 08/29/2016   CALCIUM 8.7 (L) 08/29/2016   PROT 7.0 08/29/2016   ALBUMIN 4.2 08/29/2016   AST 32 08/29/2016   ALT 60 08/29/2016   ALKPHOS 89 08/29/2016   BILITOT 0.5 08/29/2016   GFRNONAA >60 08/29/2016   GFRAA >60 08/29/2016    Lab Results  Component Value Date   WBC 2.4 (L) 08/29/2016   NEUTROABS 1.0 (L) 08/29/2016   HGB 11.4 (L) 08/29/2016   HCT 32.3 (L) 08/29/2016   MCV 81.1 08/29/2016   PLT 334 08/29/2016     STUDIES: Nm Cardiac Muga Rest  Result Date: 08/07/2016 CLINICAL DATA:  Hodgkin's lymphoma cancer. Evaluate cardiac function in relation to chemotherapy. EXAM: NUCLEAR MEDICINE CARDIAC BLOOD POOL IMAGING (MUGA) TECHNIQUE: Cardiac multi-gated acquisition was performed at rest following intravenous injection of Tc-39mlabeled red blood cells. RADIOPHARMACEUTICALS:  22.2 mCi Tc-951mDP in-vitro labeled red blood cells IV COMPARISON:  MUGA scan 05/01/2016 FINDINGS: No  focal wall motion abnormality of the left ventricle. Calculated left ventricular ejection fraction equals 61%. This compares to 67% on 05/01/2016. IMPRESSION: 1. LEFT ventricle ejection fraction equals 61% 2. No wall motion abnormality. Electronically Signed   By: StSuzy Bouchard.D.   On: 08/07/2016 13:31    ASSESSMENT: Stage III nodular sclerosing classical Hodgkin's  lymphoma of the neck  PLAN:    1. 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 Bone marrow biopsy was negative. MUGA scan and PFTs are adequate to proceed with treatment. Repeat MUGA scan on Aug 07, 2016 reported ejection fraction of 61%. Patient appears to only need Neulasta on day 15 of every treatment. Patient will receive treatment on days 1 and 15 for a total of 6 cycles, provided his blood counts tolerate this schedule. Proceed with cycle 4, day 15 tomorrow. Patient will return to clinic in 2 weeks for further evaluation and consideration of cycle 5, day 1. 2. Neutropenia: Resolved. Patient has requested limit Neulasta use secondary to side effects, therefore likely will only receive treatment on the day 15 of each cycle.   3. Peripheral neuropathy: Mild, monitor. 4. Hypertension: Blood pressure mildly elevated today, monitor.  Approximately 30 minutes was spent in discussion of which greater than 50% was consultation.  Patient expressed understanding and was in agreement with this plan. He also understands that He can call clinic at any time with any questions, concerns, or complaints.  Cancer Staging Nodular sclerosis Hodgkin lymphoma of lymph nodes of neck (HCC) Staging form: Hodgkin and Non-Hodgkin Lymphoma, AJCC 8th Edition - Clinical stage from 05/09/2016: Stage III (Hodgkin lymphoma, A - Asymptomatic) - Signed by Lloyd Huger, MD on 05/09/2016   Lloyd Huger, MD   09/04/2016 1:47 PM

## 2016-08-29 ENCOUNTER — Inpatient Hospital Stay (HOSPITAL_BASED_OUTPATIENT_CLINIC_OR_DEPARTMENT_OTHER): Payer: BLUE CROSS/BLUE SHIELD | Admitting: Oncology

## 2016-08-29 ENCOUNTER — Inpatient Hospital Stay: Payer: BLUE CROSS/BLUE SHIELD | Attending: Oncology

## 2016-08-29 VITALS — BP 149/90 | HR 88 | Temp 96.8°F | Resp 20 | Wt 224.7 lb

## 2016-08-29 DIAGNOSIS — C8111 Nodular sclerosis classical Hodgkin lymphoma, lymph nodes of head, face, and neck: Secondary | ICD-10-CM | POA: Insufficient documentation

## 2016-08-29 DIAGNOSIS — Z79899 Other long term (current) drug therapy: Secondary | ICD-10-CM | POA: Insufficient documentation

## 2016-08-29 DIAGNOSIS — Z87891 Personal history of nicotine dependence: Secondary | ICD-10-CM | POA: Diagnosis not present

## 2016-08-29 DIAGNOSIS — Z7689 Persons encountering health services in other specified circumstances: Secondary | ICD-10-CM | POA: Diagnosis not present

## 2016-08-29 DIAGNOSIS — Z800 Family history of malignant neoplasm of digestive organs: Secondary | ICD-10-CM

## 2016-08-29 DIAGNOSIS — G629 Polyneuropathy, unspecified: Secondary | ICD-10-CM | POA: Insufficient documentation

## 2016-08-29 DIAGNOSIS — Z5111 Encounter for antineoplastic chemotherapy: Secondary | ICD-10-CM | POA: Insufficient documentation

## 2016-08-29 DIAGNOSIS — R112 Nausea with vomiting, unspecified: Secondary | ICD-10-CM | POA: Diagnosis not present

## 2016-08-29 DIAGNOSIS — I10 Essential (primary) hypertension: Secondary | ICD-10-CM | POA: Insufficient documentation

## 2016-08-29 LAB — COMPREHENSIVE METABOLIC PANEL
ALK PHOS: 89 U/L (ref 38–126)
ALT: 60 U/L (ref 17–63)
AST: 32 U/L (ref 15–41)
Albumin: 4.2 g/dL (ref 3.5–5.0)
Anion gap: 6 (ref 5–15)
BUN: 16 mg/dL (ref 6–20)
CO2: 23 mmol/L (ref 22–32)
CREATININE: 0.83 mg/dL (ref 0.61–1.24)
Calcium: 8.7 mg/dL — ABNORMAL LOW (ref 8.9–10.3)
Chloride: 107 mmol/L (ref 101–111)
GFR calc Af Amer: >60 mL/min (ref >60)
GFR calc non Af Amer: >60 mL/min (ref >60)
GLUCOSE: 106 mg/dL — AB (ref 65–99)
Potassium: 3.9 mmol/L (ref 3.5–5.1)
SODIUM: 136 mmol/L (ref 135–145)
Total Bilirubin: 0.5 mg/dL (ref 0.3–1.2)
Total Protein: 7 g/dL (ref 6.5–8.1)

## 2016-08-29 LAB — CBC WITH DIFFERENTIAL/PLATELET
BASOS ABS: 0.1 10*3/uL (ref 0–0.1)
Basophils Relative: 3 %
EOS PCT: 5 %
Eosinophils Absolute: 0.1 10*3/uL (ref 0–0.7)
HCT: 32.3 % — ABNORMAL LOW (ref 40.0–52.0)
HEMOGLOBIN: 11.4 g/dL — AB (ref 13.0–18.0)
LYMPHS PCT: 35 %
Lymphs Abs: 0.8 10*3/uL — ABNORMAL LOW (ref 1.0–3.6)
MCH: 28.5 pg (ref 26.0–34.0)
MCHC: 35.1 g/dL (ref 32.0–36.0)
MCV: 81.1 fL (ref 80.0–100.0)
Monocytes Absolute: 0.4 10*3/uL (ref 0.2–1.0)
Monocytes Relative: 16 %
NEUTROS PCT: 41 %
Neutro Abs: 1 10*3/uL — ABNORMAL LOW (ref 1.4–6.5)
Platelets: 334 10*3/uL (ref 150–440)
RBC: 3.98 MIL/uL — AB (ref 4.40–5.90)
RDW: 23 % — ABNORMAL HIGH (ref 11.5–14.5)
WBC: 2.4 10*3/uL — AB (ref 3.8–10.6)

## 2016-08-29 NOTE — Progress Notes (Signed)
Patient reports continued neuropathy.

## 2016-08-30 ENCOUNTER — Other Ambulatory Visit: Payer: BLUE CROSS/BLUE SHIELD

## 2016-08-30 ENCOUNTER — Ambulatory Visit: Payer: BLUE CROSS/BLUE SHIELD | Admitting: Oncology

## 2016-08-31 ENCOUNTER — Inpatient Hospital Stay: Payer: BLUE CROSS/BLUE SHIELD

## 2016-08-31 VITALS — BP 144/90 | HR 99 | Temp 96.0°F | Resp 20

## 2016-08-31 DIAGNOSIS — C8111 Nodular sclerosis classical Hodgkin lymphoma, lymph nodes of head, face, and neck: Secondary | ICD-10-CM

## 2016-08-31 MED ORDER — DOXORUBICIN HCL CHEMO IV INJECTION 2 MG/ML
25.0000 mg/m2 | Freq: Once | INTRAVENOUS | Status: AC
  Administered 2016-08-31: 54 mg via INTRAVENOUS
  Filled 2016-08-31: qty 27

## 2016-08-31 MED ORDER — SODIUM CHLORIDE 0.9 % IV SOLN
Freq: Once | INTRAVENOUS | Status: AC
  Administered 2016-08-31: 11:00:00 via INTRAVENOUS
  Filled 2016-08-31: qty 5

## 2016-08-31 MED ORDER — SODIUM CHLORIDE 0.9 % IV SOLN
Freq: Once | INTRAVENOUS | Status: AC
  Administered 2016-08-31: 11:00:00 via INTRAVENOUS
  Filled 2016-08-31: qty 1000

## 2016-08-31 MED ORDER — SODIUM CHLORIDE 0.9 % IV SOLN
375.0000 mg/m2 | Freq: Once | INTRAVENOUS | Status: AC
  Administered 2016-08-31: 800 mg via INTRAVENOUS
  Filled 2016-08-31: qty 40

## 2016-08-31 MED ORDER — VINBLASTINE SULFATE CHEMO INJECTION 1 MG/ML
13.0000 mg | Freq: Once | INTRAVENOUS | Status: AC
  Administered 2016-08-31: 13 mg via INTRAVENOUS
  Filled 2016-08-31: qty 13

## 2016-08-31 MED ORDER — HEPARIN SOD (PORK) LOCK FLUSH 100 UNIT/ML IV SOLN
500.0000 [IU] | Freq: Once | INTRAVENOUS | Status: AC | PRN
  Administered 2016-08-31: 500 [IU]
  Filled 2016-08-31: qty 5

## 2016-08-31 MED ORDER — PEGFILGRASTIM 6 MG/0.6ML ~~LOC~~ PSKT
6.0000 mg | PREFILLED_SYRINGE | Freq: Once | SUBCUTANEOUS | Status: AC
  Administered 2016-08-31: 6 mg via SUBCUTANEOUS
  Filled 2016-08-31: qty 0.6

## 2016-08-31 MED ORDER — BLEOMYCIN SULFATE CHEMO INJECTION 30 UNIT
10.0000 [IU]/m2 | Freq: Once | INTRAMUSCULAR | Status: AC
  Administered 2016-08-31: 21 [IU] via INTRAVENOUS
  Filled 2016-08-31: qty 7

## 2016-09-12 NOTE — Progress Notes (Signed)
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 The swelling in his neck has essentially resolved. He continues to have a mild peripheral neuropathy that does not affect his day-to-day activity. He has increased nausea and vomiting on the day of his treatments, but otherwise is tolerating them well. He currently feels well and is asymptomatic. He denies any fevers, night sweats, or weight loss. He has no other neurologic complaints. He denies any dysphagia or difficulty swallowing. He has no chest pain or shortness of breath. He denies any nausea, vomiting, constipation, or diarrhea. He has no urinary complaints. Patient offers no further specific complaints today.  REVIEW OF SYSTEMS:   Review of Systems  Constitutional: Negative.  Negative for fever, malaise/fatigue and weight loss.  HENT: Negative.   Respiratory: Negative.  Negative for cough and shortness of breath.   Cardiovascular: Negative.  Negative for chest pain and leg swelling.  Gastrointestinal: Positive for nausea and vomiting. Negative for abdominal pain.  Genitourinary: Negative.   Musculoskeletal: Negative.  Negative for neck pain.  Skin: Negative.  Negative for rash.  Neurological: Positive for sensory change. Negative for weakness.  Psychiatric/Behavioral: Negative.  The patient is not nervous/anxious.     As per HPI. Otherwise, a complete review of systems is negative.  PAST MEDICAL HISTORY: Past Medical History:  Diagnosis Date  . Medical history non-contributory     PAST SURGICAL HISTORY: Past Surgical History:  Procedure Laterality  Date  . LYMPH NODE BIOPSY Right 04/13/2016   Procedure: biopsy of lymph nodes open deep cervical node (right);  Surgeon: Margaretha Sheffield, MD;  Location: [facility];  Service: ENT;  Laterality: Right;  . NO PAST SURGERIES    . PORTA CATH INSERTION N/A 05/15/2016   Procedure: Glori Luis Cath Insertion;  Surgeon: Katha Cabal, MD;  Location: [place] [facility];  Service: Cardiovascular;  Laterality: N/A;    FAMILY HISTORY: Family History  Problem Relation Age of Onset  . Heart disease Father   . COPD Father   . Hypertension Father   . Hypertension Brother   . Alzheimer's disease Maternal Grandmother   . Pancreatic cancer Maternal Grandfather     ADVANCED DIRECTIVES (Y/N):  N  HEALTH MAINTENANCE: Social History  Substance Use Topics  . Smoking status: Former Research scientist (life sciences)  . Smokeless tobacco: Former Systems developer    Types: Snuff    Quit date: 05/25/2015     Comment: social in college  . Alcohol use 1.2 oz/week    2 Glasses of wine per week     Colonoscopy:  PAP:  Bone density:  Lipid panel:  No Known Allergies  Current Outpatient Prescriptions  Medication Sig Dispense Refill  . acetaminophen (TYLENOL) 325 MG tablet Take 650 mg by mouth every 6 (six) hours as needed for moderate pain or headache.    . lidocaine-prilocaine (EMLA) cream Apply to affected area once 30 g 3  . ondansetron (ZOFRAN) 8 MG tablet Take 1 tablet (8 mg total) by mouth 2 (two) times daily as needed. (Patient not taking: Reported on 09/13/2016) 30 tablet 1  . prochlorperazine (COMPAZINE) 10 MG tablet Take 1 tablet (10 mg total) by mouth  every 6 (six) hours as needed (Nausea or vomiting). (Patient not taking: Reported on 09/13/2016) 30 tablet 1   Current Facility-Administered Medications  Medication Dose Route Frequency Provider Last Rate Last Dose  . ceFAZolin (ANCEF) IVPB 1 g/50 mL premix  1 g Intravenous Once Schnier, Dolores Lory, MD        OBJECTIVE: Vitals:   09/13/16 1511  BP: (!) 149/84  Pulse: (!) 101    Temp: 97.8 F (36.6 C)     Body mass index is 34.14 kg/m.    ECOG FS:0 - Asymptomatic  General: Well-developed, well-nourished, no acute distress. Eyes: Pink conjunctiva, anicteric sclera. HEENT: Large easily palpable right neck mass, nearly resolved. Lungs: Clear to auscultation bilaterally. Heart: Regular rate and rhythm. No rubs, murmurs, or gallops. Abdomen: Soft, nontender, nondistended. No organomegaly noted, normoactive bowel sounds. Musculoskeletal: No edema, cyanosis, or clubbing. Neuro: Alert, answering all questions appropriately. Cranial nerves grossly intact. Skin: No rashes or petechiae noted. Psych: Normal affect.   LAB RESULTS:  Lab Results  Component Value Date   NA 136 09/13/2016   K 3.5 09/13/2016   CL 102 09/13/2016   CO2 25 09/13/2016   GLUCOSE 125 (H) 09/13/2016   BUN 17 09/13/2016   CREATININE 1.07 09/13/2016   CALCIUM 9.1 09/13/2016   PROT 7.4 09/13/2016   ALBUMIN 4.3 09/13/2016   AST 45 (H) 09/13/2016   ALT 57 09/13/2016   ALKPHOS 108 09/13/2016   BILITOT 0.5 09/13/2016   GFRNONAA >60 09/13/2016   GFRAA >60 09/13/2016    Lab Results  Component Value Date   WBC 11.4 (H) 09/13/2016   NEUTROABS 9.0 (H) 09/13/2016   HGB 12.5 (L) 09/13/2016   HCT 34.6 (L) 09/13/2016   MCV 82.3 09/13/2016   PLT 176 09/13/2016     STUDIES: No results found.  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 Bone marrow biopsy was negative. MUGA scan and PFTs are adequate to proceed with treatment. Repeat MUGA scan on Aug 07, 2016 reported ejection fraction of 61%. Patient appears to only need Neulasta on day 15 of every treatment. Patient will receive treatment on days 1 and 15 for a total of 6 cycles, provided his blood counts tolerate this schedule. Proceed with cycle 5,  day 1 tomorrow. Patient will return to clinic in 2 weeks for further evaluation and consideration of cycle 5, day 15. 2. Neutropenia: Resolved. Patient has requested limit Neulasta use secondary to side effects, therefore likely will only receive treatment on the day 15 of each cycle.   3. Peripheral neuropathy: Mild, monitor. 4. Hypertension: Blood pressure mildly elevated today, monitor. 5. Nausea and vomiting: Patient was instructed to take his at home prescriptions on the day of this treatment in addition to his premedications.  Patient expressed understanding and was in agreement with this plan. He also understands that He can call clinic at any time with any questions, concerns, or complaints.   Cancer Staging Nodular sclerosis Hodgkin lymphoma of lymph nodes of neck (HCC) Staging form: Hodgkin and Non-Hodgkin Lymphoma, AJCC 8th Edition - Clinical stage from 05/09/2016: Stage III (Hodgkin lymphoma, A - Asymptomatic) - Signed by Lloyd Huger, MD on 05/09/2016   Lloyd Huger, MD   09/16/2016 9:27 AM

## 2016-09-13 ENCOUNTER — Inpatient Hospital Stay (HOSPITAL_BASED_OUTPATIENT_CLINIC_OR_DEPARTMENT_OTHER): Payer: BLUE CROSS/BLUE SHIELD | Admitting: Oncology

## 2016-09-13 ENCOUNTER — Inpatient Hospital Stay: Payer: BLUE CROSS/BLUE SHIELD

## 2016-09-13 VITALS — BP 149/84 | HR 101 | Temp 97.8°F | Wt 224.5 lb

## 2016-09-13 DIAGNOSIS — Z79899 Other long term (current) drug therapy: Secondary | ICD-10-CM

## 2016-09-13 DIAGNOSIS — C8111 Nodular sclerosis classical Hodgkin lymphoma, lymph nodes of head, face, and neck: Secondary | ICD-10-CM

## 2016-09-13 DIAGNOSIS — R112 Nausea with vomiting, unspecified: Secondary | ICD-10-CM

## 2016-09-13 DIAGNOSIS — G629 Polyneuropathy, unspecified: Secondary | ICD-10-CM

## 2016-09-13 DIAGNOSIS — I10 Essential (primary) hypertension: Secondary | ICD-10-CM | POA: Diagnosis not present

## 2016-09-13 DIAGNOSIS — Z800 Family history of malignant neoplasm of digestive organs: Secondary | ICD-10-CM

## 2016-09-13 DIAGNOSIS — Z7689 Persons encountering health services in other specified circumstances: Secondary | ICD-10-CM | POA: Diagnosis not present

## 2016-09-13 DIAGNOSIS — Z87891 Personal history of nicotine dependence: Secondary | ICD-10-CM

## 2016-09-13 LAB — COMPREHENSIVE METABOLIC PANEL
ALK PHOS: 108 U/L (ref 38–126)
ALT: 57 U/L (ref 17–63)
ANION GAP: 9 (ref 5–15)
AST: 45 U/L — ABNORMAL HIGH (ref 15–41)
Albumin: 4.3 g/dL (ref 3.5–5.0)
BUN: 17 mg/dL (ref 6–20)
CALCIUM: 9.1 mg/dL (ref 8.9–10.3)
CO2: 25 mmol/L (ref 22–32)
Chloride: 102 mmol/L (ref 101–111)
Creatinine, Ser: 1.07 mg/dL (ref 0.61–1.24)
GFR calc non Af Amer: >60 mL/min (ref >60)
Glucose, Bld: 125 mg/dL — ABNORMAL HIGH (ref 65–99)
POTASSIUM: 3.5 mmol/L (ref 3.5–5.1)
SODIUM: 136 mmol/L (ref 135–145)
Total Bilirubin: 0.5 mg/dL (ref 0.3–1.2)
Total Protein: 7.4 g/dL (ref 6.5–8.1)

## 2016-09-13 LAB — CBC WITH DIFFERENTIAL/PLATELET
BASOS PCT: 1 %
Basophils Absolute: 0.1 10*3/uL (ref 0–0.1)
EOS PCT: 2 %
Eosinophils Absolute: 0.2 10*3/uL (ref 0–0.7)
HCT: 34.6 % — ABNORMAL LOW (ref 40.0–52.0)
HEMOGLOBIN: 12.5 g/dL — AB (ref 13.0–18.0)
LYMPHS ABS: 1.4 10*3/uL (ref 1.0–3.6)
Lymphocytes Relative: 12 %
MCH: 29.6 pg (ref 26.0–34.0)
MCHC: 36 g/dL (ref 32.0–36.0)
MCV: 82.3 fL (ref 80.0–100.0)
MONO ABS: 0.7 10*3/uL (ref 0.2–1.0)
Monocytes Relative: 6 %
NEUTROS ABS: 9 10*3/uL — AB (ref 1.4–6.5)
NEUTROS PCT: 79 %
Platelets: 176 10*3/uL (ref 150–440)
RBC: 4.21 MIL/uL — ABNORMAL LOW (ref 4.40–5.90)
RDW: 21.6 % — AB (ref 11.5–14.5)
WBC: 11.4 10*3/uL — ABNORMAL HIGH (ref 3.8–10.6)

## 2016-09-13 NOTE — Progress Notes (Signed)
Patient here today for follow up.  Patient c/o nausea/vomiting after the past 2 treatments.

## 2016-09-14 ENCOUNTER — Other Ambulatory Visit: Payer: Self-pay | Admitting: Oncology

## 2016-09-14 ENCOUNTER — Inpatient Hospital Stay: Payer: BLUE CROSS/BLUE SHIELD

## 2016-09-14 VITALS — BP 121/77 | HR 73 | Temp 98.1°F | Resp 16

## 2016-09-14 DIAGNOSIS — C8111 Nodular sclerosis classical Hodgkin lymphoma, lymph nodes of head, face, and neck: Secondary | ICD-10-CM | POA: Diagnosis not present

## 2016-09-14 MED ORDER — HEPARIN SOD (PORK) LOCK FLUSH 100 UNIT/ML IV SOLN
500.0000 [IU] | Freq: Once | INTRAVENOUS | Status: DC | PRN
  Filled 2016-09-14 (×2): qty 5

## 2016-09-14 MED ORDER — SODIUM CHLORIDE 0.9 % IV SOLN
Freq: Once | INTRAVENOUS | Status: AC
  Administered 2016-09-14: 10:00:00 via INTRAVENOUS
  Filled 2016-09-14: qty 1000

## 2016-09-14 MED ORDER — VINBLASTINE SULFATE CHEMO INJECTION 1 MG/ML
13.0000 mg | Freq: Once | INTRAVENOUS | Status: AC
  Administered 2016-09-14: 13 mg via INTRAVENOUS
  Filled 2016-09-14: qty 13

## 2016-09-14 MED ORDER — HEPARIN SOD (PORK) LOCK FLUSH 100 UNIT/ML IV SOLN
500.0000 [IU] | Freq: Once | INTRAVENOUS | Status: AC
  Administered 2016-09-14: 500 [IU] via INTRAVENOUS

## 2016-09-14 MED ORDER — SODIUM CHLORIDE 0.9 % IV SOLN
375.0000 mg/m2 | Freq: Once | INTRAVENOUS | Status: AC
  Administered 2016-09-14: 800 mg via INTRAVENOUS
  Filled 2016-09-14: qty 40

## 2016-09-14 MED ORDER — SODIUM CHLORIDE 0.9% FLUSH
10.0000 mL | INTRAVENOUS | Status: DC | PRN
  Administered 2016-09-14: 10 mL via INTRAVENOUS
  Filled 2016-09-14: qty 10

## 2016-09-14 MED ORDER — SODIUM CHLORIDE 0.9 % IV SOLN
10.0000 [IU]/m2 | Freq: Once | INTRAVENOUS | Status: AC
  Administered 2016-09-14: 21 [IU] via INTRAVENOUS
  Filled 2016-09-14: qty 7

## 2016-09-14 MED ORDER — PALONOSETRON HCL INJECTION 0.25 MG/5ML
0.2500 mg | Freq: Once | INTRAVENOUS | Status: AC
  Administered 2016-09-14: 0.25 mg via INTRAVENOUS
  Filled 2016-09-14: qty 5

## 2016-09-14 MED ORDER — SODIUM CHLORIDE 0.9 % IV SOLN
Freq: Once | INTRAVENOUS | Status: AC
  Administered 2016-09-14: 10:00:00 via INTRAVENOUS
  Filled 2016-09-14: qty 5

## 2016-09-14 MED ORDER — DOXORUBICIN HCL CHEMO IV INJECTION 2 MG/ML
25.0000 mg/m2 | Freq: Once | INTRAVENOUS | Status: AC
  Administered 2016-09-14: 54 mg via INTRAVENOUS
  Filled 2016-09-14: qty 27

## 2016-09-25 NOTE — Progress Notes (Signed)
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 The swelling in his neck has essentially resolved. He continues to have a mild peripheral neuropathy but it resolves before his next treatment. He has increased nausea and vomiting on the day of his treatments, but otherwise is tolerating them well. He currently feels well and is asymptomatic. He denies any fevers, night sweats, or weight loss. He has no other neurologic complaints. He denies any dysphagia or difficulty swallowing. He has no chest pain or shortness of breath. He denies any nausea, vomiting, constipation, or diarrhea. He has no urinary complaints. Patient offers no further specific complaints today.  REVIEW OF SYSTEMS:   Review of Systems  Constitutional: Negative.  Negative for fever, malaise/fatigue and weight loss.  HENT: Negative.   Respiratory: Negative.  Negative for cough and shortness of breath.   Cardiovascular: Negative.  Negative for chest pain and leg swelling.  Gastrointestinal: Positive for nausea and vomiting. Negative for abdominal pain.  Genitourinary: Negative.   Musculoskeletal: Negative.  Negative for neck pain.  Skin: Negative.  Negative for rash.  Neurological: Positive for sensory change. Negative for weakness.  Psychiatric/Behavioral: Negative.  The patient is not nervous/anxious.     As per HPI. Otherwise, a complete review of systems is negative.  PAST MEDICAL HISTORY: Past Medical History:  Diagnosis Date  . Medical history non-contributory     PAST SURGICAL HISTORY: Past Surgical History:  Procedure Laterality  Date  . LYMPH NODE BIOPSY Right 04/13/2016   Procedure: biopsy of lymph nodes open deep cervical node (right);  Surgeon: Margaretha Sheffield, MD;  Location: [facility];  Service: ENT;  Laterality: Right;  . NO PAST SURGERIES    . PORTA CATH INSERTION N/A 05/15/2016   Procedure: Glori Luis Cath Insertion;  Surgeon: Katha Cabal, MD;  Location: [facility];  Service: Cardiovascular;  Laterality: N/A;    FAMILY HISTORY: Family History  Problem Relation Age of Onset  . Heart disease Father   . COPD Father   . Hypertension Father   . Hypertension Brother   . Alzheimer's disease Maternal Grandmother   . Pancreatic cancer Maternal Grandfather     ADVANCED DIRECTIVES (Y/N):  N  HEALTH MAINTENANCE: Social History  Substance Use Topics  . Smoking status: Former Research scientist (life sciences)  . Smokeless tobacco: Former Systems developer    Types: Snuff    Quit date: 05/25/2015     Comment: social in college  . Alcohol use 1.2 oz/week    2 Glasses of wine per week     Colonoscopy:  PAP:  Bone density:  Lipid panel:  No Known Allergies  Current Outpatient Prescriptions  Medication Sig Dispense Refill  . acetaminophen (TYLENOL) 325 MG tablet Take 650 mg by mouth every 6 (six) hours as needed for moderate pain or headache.    . lidocaine-prilocaine (EMLA) cream Apply to affected area once 30 g 3  . ondansetron (ZOFRAN) 8 MG tablet Take 1 tablet (8 mg total) by mouth 2 (two) times daily as needed. 30 tablet 1  . prochlorperazine (COMPAZINE) 10 MG tablet Take 1 tablet (10 mg total) by mouth every 6 (six) hours as needed (  Nausea or vomiting). 30 tablet 1   Current Facility-Administered Medications  Medication Dose Route Frequency Provider Last Rate Last Dose  . ceFAZolin (ANCEF) IVPB 1 g/50 mL premix  1 g Intravenous Once Schnier, Dolores Lory, MD        OBJECTIVE: Vitals:   09/27/16 1437  BP: 125/77  Pulse: (!) 105  Resp: 20  Temp: 97.7 F (36.5 C)     Body mass index is 34.59 kg/m.    ECOG FS:0 -  Asymptomatic  General: Well-developed, well-nourished, no acute distress. Eyes: Pink conjunctiva, anicteric sclera. HEENT: Large easily palpable right neck mass, nearly resolved. Lungs: Clear to auscultation bilaterally. Heart: Regular rate and rhythm. No rubs, murmurs, or gallops. Abdomen: Soft, nontender, nondistended. No organomegaly noted, normoactive bowel sounds. Musculoskeletal: No edema, cyanosis, or clubbing. Neuro: Alert, answering all questions appropriately. Cranial nerves grossly intact. Skin: No rashes or petechiae noted. Psych: Normal affect.   LAB RESULTS:  Lab Results  Component Value Date   NA 137 09/27/2016   K 3.4 (L) 09/27/2016   CL 104 09/27/2016   CO2 24 09/27/2016   GLUCOSE 130 (H) 09/27/2016   BUN 18 09/27/2016   CREATININE 1.07 09/27/2016   CALCIUM 8.8 (L) 09/27/2016   PROT 6.8 09/27/2016   ALBUMIN 4.1 09/27/2016   AST 31 09/27/2016   ALT 50 09/27/2016   ALKPHOS 91 09/27/2016   BILITOT 0.5 09/27/2016   GFRNONAA >60 09/27/2016   GFRAA >60 09/27/2016    Lab Results  Component Value Date   WBC 2.5 (L) 09/27/2016   NEUTROABS 1.0 (L) 09/27/2016   HGB 11.5 (L) 09/27/2016   HCT 32.4 (L) 09/27/2016   MCV 83.1 09/27/2016   PLT 261 09/27/2016     STUDIES: No results found.  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 Bone marrow biopsy was negative. MUGA scan and PFTs are adequate to proceed with treatment. Repeat MUGA scan on Aug 07, 2016 reported ejection fraction of 61%. Patient appears to only need Neulasta on day 15 of every treatment. Patient will receive treatment on days 1 and 15 for a total of 6 cycles, provided his blood counts tolerate this schedule. Proceed with cycle 5, day 15 tomorrow. Patient will return to clinic in 2 weeks for further  evaluation and consideration of cycle 6, day 1. 2. Neutropenia: Resolved. Patient has requested limit Neulasta use secondary to side effects, therefore likely will only receive treatment on the day 15 of each cycle.   3. Peripheral neuropathy: Mild, monitor. 4. Hypertension: Resolved.  5. Nausea and vomiting: Better with medications. Re-ordered both prescriptions.   Patient expressed understanding and was in agreement with this plan. He also understands that He can call clinic at any time with any questions, concerns, or complaints.   Cancer Staging Nodular sclerosis Hodgkin lymphoma of lymph nodes of neck ([place]) Staging form: Hodgkin and Non-Hodgkin Lymphoma, AJCC 8th Edition - Clinical stage from 05/09/2016: Stage III (Hodgkin lymphoma, A - Asymptomatic) - Signed by Lloyd Huger, MD on 05/09/2016  Faythe Casa, NP 09/27/2016 3:03 PM   Jacquelin Hawking, NP   09/27/2016 3:03 PM

## 2016-09-27 ENCOUNTER — Inpatient Hospital Stay (HOSPITAL_BASED_OUTPATIENT_CLINIC_OR_DEPARTMENT_OTHER): Payer: BLUE CROSS/BLUE SHIELD | Admitting: Oncology

## 2016-09-27 ENCOUNTER — Inpatient Hospital Stay: Payer: BLUE CROSS/BLUE SHIELD | Attending: Oncology

## 2016-09-27 VITALS — BP 125/77 | HR 105 | Temp 97.7°F | Resp 20 | Wt 227.5 lb

## 2016-09-27 DIAGNOSIS — Z87891 Personal history of nicotine dependence: Secondary | ICD-10-CM | POA: Insufficient documentation

## 2016-09-27 DIAGNOSIS — G629 Polyneuropathy, unspecified: Secondary | ICD-10-CM | POA: Diagnosis not present

## 2016-09-27 DIAGNOSIS — I10 Essential (primary) hypertension: Secondary | ICD-10-CM | POA: Diagnosis not present

## 2016-09-27 DIAGNOSIS — C8111 Nodular sclerosis classical Hodgkin lymphoma, lymph nodes of head, face, and neck: Secondary | ICD-10-CM | POA: Insufficient documentation

## 2016-09-27 DIAGNOSIS — Z5111 Encounter for antineoplastic chemotherapy: Secondary | ICD-10-CM | POA: Diagnosis not present

## 2016-09-27 DIAGNOSIS — R112 Nausea with vomiting, unspecified: Secondary | ICD-10-CM | POA: Diagnosis not present

## 2016-09-27 DIAGNOSIS — Z800 Family history of malignant neoplasm of digestive organs: Secondary | ICD-10-CM | POA: Insufficient documentation

## 2016-09-27 DIAGNOSIS — Z79899 Other long term (current) drug therapy: Secondary | ICD-10-CM | POA: Diagnosis not present

## 2016-09-27 DIAGNOSIS — Z7689 Persons encountering health services in other specified circumstances: Secondary | ICD-10-CM | POA: Insufficient documentation

## 2016-09-27 LAB — CBC WITH DIFFERENTIAL/PLATELET
BASOS ABS: 0.2 10*3/uL — AB (ref 0–0.1)
BASOS PCT: 6 %
Eosinophils Absolute: 0.2 10*3/uL (ref 0–0.7)
Eosinophils Relative: 9 %
HEMATOCRIT: 32.4 % — AB (ref 40.0–52.0)
HEMOGLOBIN: 11.5 g/dL — AB (ref 13.0–18.0)
Lymphocytes Relative: 29 %
Lymphs Abs: 0.7 10*3/uL — ABNORMAL LOW (ref 1.0–3.6)
MCH: 29.6 pg (ref 26.0–34.0)
MCHC: 35.5 g/dL (ref 32.0–36.0)
MCV: 83.1 fL (ref 80.0–100.0)
MONOS PCT: 18 %
Monocytes Absolute: 0.5 10*3/uL (ref 0.2–1.0)
NEUTROS ABS: 1 10*3/uL — AB (ref 1.4–6.5)
Neutrophils Relative %: 38 %
Platelets: 261 10*3/uL (ref 150–440)
RBC: 3.9 MIL/uL — ABNORMAL LOW (ref 4.40–5.90)
RDW: 20.7 % — ABNORMAL HIGH (ref 11.5–14.5)
WBC: 2.5 10*3/uL — ABNORMAL LOW (ref 3.8–10.6)

## 2016-09-27 LAB — COMPREHENSIVE METABOLIC PANEL
ALBUMIN: 4.1 g/dL (ref 3.5–5.0)
ALK PHOS: 91 U/L (ref 38–126)
ALT: 50 U/L (ref 17–63)
AST: 31 U/L (ref 15–41)
Anion gap: 9 (ref 5–15)
BILIRUBIN TOTAL: 0.5 mg/dL (ref 0.3–1.2)
BUN: 18 mg/dL (ref 6–20)
CALCIUM: 8.8 mg/dL — AB (ref 8.9–10.3)
CO2: 24 mmol/L (ref 22–32)
Chloride: 104 mmol/L (ref 101–111)
Creatinine, Ser: 1.07 mg/dL (ref 0.61–1.24)
GFR calc Af Amer: >60 mL/min (ref >60)
GFR calc non Af Amer: >60 mL/min (ref >60)
GLUCOSE: 130 mg/dL — AB (ref 65–99)
Potassium: 3.4 mmol/L — ABNORMAL LOW (ref 3.5–5.1)
Sodium: 137 mmol/L (ref 135–145)
TOTAL PROTEIN: 6.8 g/dL (ref 6.5–8.1)

## 2016-09-27 MED ORDER — PROCHLORPERAZINE MALEATE 10 MG PO TABS: 10.0000 mg | ORAL_TABLET | Freq: Four times a day (QID) | ORAL | 1 refills | Status: DC | PRN

## 2016-09-27 MED ORDER — ONDANSETRON HCL 8 MG PO TABS: 8.0000 mg | ORAL_TABLET | Freq: Two times a day (BID) | ORAL | 1 refills | Status: DC | PRN

## 2016-09-27 NOTE — Progress Notes (Signed)
Patient denies any concerns today.  

## 2016-09-28 ENCOUNTER — Inpatient Hospital Stay: Payer: BLUE CROSS/BLUE SHIELD

## 2016-09-28 DIAGNOSIS — C8111 Nodular sclerosis classical Hodgkin lymphoma, lymph nodes of head, face, and neck: Secondary | ICD-10-CM | POA: Diagnosis not present

## 2016-09-28 MED ORDER — VINBLASTINE SULFATE CHEMO INJECTION 1 MG/ML
13.0000 mg | Freq: Once | INTRAVENOUS | Status: AC
  Administered 2016-09-28: 13 mg via INTRAVENOUS
  Filled 2016-09-28: qty 13

## 2016-09-28 MED ORDER — HEPARIN SOD (PORK) LOCK FLUSH 100 UNIT/ML IV SOLN
500.0000 [IU] | Freq: Once | INTRAVENOUS | Status: AC | PRN
  Administered 2016-09-28: 500 [IU]

## 2016-09-28 MED ORDER — SODIUM CHLORIDE 0.9 % IV SOLN
10.0000 [IU]/m2 | Freq: Once | INTRAVENOUS | Status: AC
  Administered 2016-09-28: 21 [IU] via INTRAVENOUS
  Filled 2016-09-28: qty 7

## 2016-09-28 MED ORDER — PEGFILGRASTIM 6 MG/0.6ML ~~LOC~~ PSKT
6.0000 mg | PREFILLED_SYRINGE | Freq: Once | SUBCUTANEOUS | Status: AC
  Administered 2016-09-28: 6 mg via SUBCUTANEOUS

## 2016-09-28 MED ORDER — DACARBAZINE 200 MG IV SOLR
375.0000 mg/m2 | Freq: Once | INTRAVENOUS | Status: AC
  Administered 2016-09-28: 800 mg via INTRAVENOUS
  Filled 2016-09-28: qty 40

## 2016-09-28 MED ORDER — DOXORUBICIN HCL CHEMO IV INJECTION 2 MG/ML
25.0000 mg/m2 | Freq: Once | INTRAVENOUS | Status: AC
  Administered 2016-09-28: 54 mg via INTRAVENOUS
  Filled 2016-09-28: qty 27

## 2016-09-28 MED ORDER — SODIUM CHLORIDE 0.9 % IV SOLN
Freq: Once | INTRAVENOUS | Status: AC
  Administered 2016-09-28: 10:00:00 via INTRAVENOUS
  Filled 2016-09-28: qty 1000

## 2016-09-28 MED ORDER — SODIUM CHLORIDE 0.9 % IV SOLN
Freq: Once | INTRAVENOUS | Status: AC
  Administered 2016-09-28: 11:00:00 via INTRAVENOUS
  Filled 2016-09-28: qty 5

## 2016-10-10 NOTE — Progress Notes (Signed)
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 He continues to have a mild peripheral neuropathy that does not affect his day-to-day activity. He has increased nausea and vomiting on the day of his treatments, but otherwise is tolerating them well. He currently feels well and is asymptomatic. He denies any fevers, night sweats, or weight loss. He has no other neurologic complaints. He denies any dysphagia or difficulty swallowing. He has no chest pain or shortness of breath. He denies any nausea, vomiting, constipation, or diarrhea. He has no urinary complaints. Patient offers no further specific complaints today.  REVIEW OF SYSTEMS:   Review of Systems  Constitutional: Negative.  Negative for fever, malaise/fatigue and weight loss.  HENT: Negative.   Respiratory: Negative.  Negative for cough and shortness of breath.   Cardiovascular: Negative.  Negative for chest pain and leg swelling.  Gastrointestinal: Positive for nausea and vomiting. Negative for abdominal pain.  Genitourinary: Negative.   Musculoskeletal: Negative.  Negative for neck pain.  Skin: Negative.  Negative for rash.  Neurological: Positive for sensory change. Negative for weakness.  Psychiatric/Behavioral: Negative.  The patient is not nervous/anxious.     As per HPI. Otherwise, a complete review of systems is negative.  PAST MEDICAL HISTORY: Past Medical History:  Diagnosis Date  . Medical history non-contributory     PAST SURGICAL HISTORY: Past Surgical History:  Procedure Laterality Date  . LYMPH NODE BIOPSY Right 04/13/2016   Procedure: biopsy of lymph nodes open deep cervical node (right);  Surgeon: Margaretha Sheffield, MD;  Location: [facility];  Service: ENT;  Laterality: Right;  . NO PAST SURGERIES    . PORTA CATH INSERTION N/A 05/15/2016   Procedure: Glori Luis Cath Insertion;  Surgeon: Katha Cabal, MD;  Location: [facility];  Service: Cardiovascular;  Laterality: N/A;    FAMILY HISTORY: Family History  Problem Relation Age of Onset  . Heart disease Father   . COPD Father   . Hypertension Father   . Hypertension Brother   . Alzheimer's disease Maternal Grandmother   . Pancreatic cancer Maternal Grandfather     ADVANCED DIRECTIVES (Y/N):  N  HEALTH MAINTENANCE: Social History  Substance Use Topics  . Smoking status: Former Research scientist (life sciences)  . Smokeless tobacco: Former Systems developer    Types: Snuff    Quit date: 05/25/2015     Comment: social in college  . Alcohol use 1.2 oz/week    2 Glasses of wine per week     Colonoscopy:  PAP:  Bone density:  Lipid panel:  No Known Allergies  Current Outpatient Prescriptions  Medication Sig Dispense Refill  . acetaminophen (TYLENOL) 325 MG tablet Take 650 mg by mouth every 6 (six) hours as needed for moderate pain or headache.    . lidocaine-prilocaine (EMLA) cream Apply to affected area once 30 g 3  . ondansetron (ZOFRAN) 8 MG tablet Take 1 tablet (8 mg total) by mouth 2 (two) times daily as needed. 30 tablet 1  . prochlorperazine (COMPAZINE) 10 MG tablet Take 1 tablet (10 mg total) by mouth every 6 (six) hours as needed (Nausea or vomiting). 30 tablet 1   Current  Facility-Administered Medications  Medication Dose Route Frequency Provider Last Rate Last Dose  . ceFAZolin (ANCEF) IVPB 1 g/50 mL premix  1 g Intravenous Once Schnier, Dolores Lory, MD        OBJECTIVE: Vitals:   10/11/16 1542  BP: 137/83  Pulse: (!) 101  Resp: 18  Temp: (!) 96.9 F (36.1 C)     Body mass index is 34.65 kg/m.    ECOG FS:0 - Asymptomatic  General: Well-developed,  well-nourished, no acute distress. Eyes: Pink conjunctiva, anicteric sclera. HEENT: Large right neck mass, resolved. Lungs: Clear to auscultation bilaterally. Heart: Regular rate and rhythm. No rubs, murmurs, or gallops. Abdomen: Soft, nontender, nondistended. No organomegaly noted, normoactive bowel sounds. Musculoskeletal: No edema, cyanosis, or clubbing. Neuro: Alert, answering all questions appropriately. Cranial nerves grossly intact. Skin: No rashes or petechiae noted. Psych: Normal affect.   LAB RESULTS:  Lab Results  Component Value Date   NA 134 (L) 10/11/2016   K 3.6 10/11/2016   CL 102 10/11/2016   CO2 24 10/11/2016   GLUCOSE 93 10/11/2016   BUN 22 (H) 10/11/2016   CREATININE 0.82 10/11/2016   CALCIUM 9.1 10/11/2016   PROT 7.4 10/11/2016   ALBUMIN 4.3 10/11/2016   AST 31 10/11/2016   ALT 53 10/11/2016   ALKPHOS 123 10/11/2016   BILITOT 0.5 10/11/2016   GFRNONAA >60 10/11/2016   GFRAA >60 10/11/2016    Lab Results  Component Value Date   WBC 15.9 (H) 10/11/2016   NEUTROABS 13.1 (H) 10/11/2016   HGB 11.7 (L) 10/11/2016   HCT 33.8 (L) 10/11/2016   MCV 83.7 10/11/2016   PLT 159 10/11/2016     STUDIES: No results found.  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 Bone marrow biopsy was negative. MUGA scan and PFTs are adequate to proceed with treatment. Repeat MUGA scan on Aug 07, 2016 reported ejection fraction of 61%. Patient appears to only need Neulasta on day 15 of every treatment. Patient will receive treatment on days 1 and 15 for a total of 6 cycles, provided his blood counts tolerate this schedule. Proceed with cycle 6, day 1 tomorrow. Patient will return to clinic in 2 weeks for further evaluation and consideration of cycle 6, day 15. Will then have a  restaging PET scan in early November with follow-up several days later. If PET scan is negative, we will continue to follow patient with CT scans every 6 months for the first 2 years after completing treatment. 2. Neutropenia: Resolved. Patient has requested limit Neulasta use secondary to side effects, therefore likely will only receive treatment on the day 15 of each cycle.   3. Peripheral neuropathy: Mild, monitor. 4. Hypertension: Blood pressure mildly elevated today, monitor. 5. Nausea and vomiting: Patient was instructed to take his at home prescriptions on the day of this treatment in addition to his premedications.  Patient expressed understanding and was in agreement with this plan. He also understands that He can call clinic at any time with any questions, concerns, or complaints.   Cancer Staging Nodular sclerosis Hodgkin lymphoma of lymph nodes of neck ([facility]) Staging form: Hodgkin and Non-Hodgkin Lymphoma, AJCC 8th Edition - Clinical stage from 05/09/2016: Stage III (Hodgkin lymphoma, A - Asymptomatic) - Signed by Lloyd Huger, MD on 05/09/2016   Lloyd Huger, MD   10/15/2016  7:16 AM

## 2016-10-11 ENCOUNTER — Inpatient Hospital Stay: Payer: BLUE CROSS/BLUE SHIELD

## 2016-10-11 ENCOUNTER — Inpatient Hospital Stay (HOSPITAL_BASED_OUTPATIENT_CLINIC_OR_DEPARTMENT_OTHER): Payer: BLUE CROSS/BLUE SHIELD | Admitting: Oncology

## 2016-10-11 VITALS — BP 137/83 | HR 101 | Temp 96.9°F | Resp 18 | Wt 227.9 lb

## 2016-10-11 DIAGNOSIS — I10 Essential (primary) hypertension: Secondary | ICD-10-CM

## 2016-10-11 DIAGNOSIS — C8111 Nodular sclerosis classical Hodgkin lymphoma, lymph nodes of head, face, and neck: Secondary | ICD-10-CM

## 2016-10-11 DIAGNOSIS — Z79899 Other long term (current) drug therapy: Secondary | ICD-10-CM

## 2016-10-11 DIAGNOSIS — G629 Polyneuropathy, unspecified: Secondary | ICD-10-CM

## 2016-10-11 DIAGNOSIS — Z800 Family history of malignant neoplasm of digestive organs: Secondary | ICD-10-CM | POA: Diagnosis not present

## 2016-10-11 DIAGNOSIS — R112 Nausea with vomiting, unspecified: Secondary | ICD-10-CM | POA: Diagnosis not present

## 2016-10-11 DIAGNOSIS — Z87891 Personal history of nicotine dependence: Secondary | ICD-10-CM

## 2016-10-11 LAB — CBC WITH DIFFERENTIAL/PLATELET
Basophils Absolute: 0.1 10*3/uL (ref 0–0.1)
Basophils Relative: 1 %
Eosinophils Absolute: 0.2 10*3/uL (ref 0–0.7)
Eosinophils Relative: 1 %
HEMATOCRIT: 33.8 % — AB (ref 40.0–52.0)
Hemoglobin: 11.7 g/dL — ABNORMAL LOW (ref 13.0–18.0)
LYMPHS PCT: 10 %
Lymphs Abs: 1.6 10*3/uL (ref 1.0–3.6)
MCH: 29.1 pg (ref 26.0–34.0)
MCHC: 34.7 g/dL (ref 32.0–36.0)
MCV: 83.7 fL (ref 80.0–100.0)
Monocytes Absolute: 0.8 10*3/uL (ref 0.2–1.0)
Monocytes Relative: 5 %
NEUTROS ABS: 13.1 10*3/uL — AB (ref 1.4–6.5)
Neutrophils Relative %: 83 %
Platelets: 159 10*3/uL (ref 150–440)
RBC: 4.04 MIL/uL — AB (ref 4.40–5.90)
RDW: 20.7 % — ABNORMAL HIGH (ref 11.5–14.5)
WBC: 15.9 10*3/uL — AB (ref 3.8–10.6)

## 2016-10-11 LAB — COMPREHENSIVE METABOLIC PANEL
ALT: 53 U/L (ref 17–63)
AST: 31 U/L (ref 15–41)
Albumin: 4.3 g/dL (ref 3.5–5.0)
Alkaline Phosphatase: 123 U/L (ref 38–126)
Anion gap: 8 (ref 5–15)
BILIRUBIN TOTAL: 0.5 mg/dL (ref 0.3–1.2)
BUN: 22 mg/dL — AB (ref 6–20)
CO2: 24 mmol/L (ref 22–32)
CREATININE: 0.82 mg/dL (ref 0.61–1.24)
Calcium: 9.1 mg/dL (ref 8.9–10.3)
Chloride: 102 mmol/L (ref 101–111)
GFR calc Af Amer: >60 mL/min (ref >60)
Glucose, Bld: 93 mg/dL (ref 65–99)
Potassium: 3.6 mmol/L (ref 3.5–5.1)
Sodium: 134 mmol/L — ABNORMAL LOW (ref 135–145)
TOTAL PROTEIN: 7.4 g/dL (ref 6.5–8.1)

## 2016-10-12 ENCOUNTER — Inpatient Hospital Stay: Payer: BLUE CROSS/BLUE SHIELD

## 2016-10-12 VITALS — BP 127/88 | HR 89 | Temp 97.4°F | Resp 18

## 2016-10-12 DIAGNOSIS — C8111 Nodular sclerosis classical Hodgkin lymphoma, lymph nodes of head, face, and neck: Secondary | ICD-10-CM | POA: Diagnosis not present

## 2016-10-12 MED ORDER — SODIUM CHLORIDE 0.9 % IV SOLN
Freq: Once | INTRAVENOUS | Status: AC
  Administered 2016-10-12: 09:00:00 via INTRAVENOUS
  Filled 2016-10-12: qty 1000

## 2016-10-12 MED ORDER — SODIUM CHLORIDE 0.9% FLUSH
10.0000 mL | INTRAVENOUS | Status: DC | PRN
  Administered 2016-10-12: 10 mL
  Filled 2016-10-12: qty 10

## 2016-10-12 MED ORDER — VINBLASTINE SULFATE CHEMO INJECTION 1 MG/ML
13.0000 mg | Freq: Once | INTRAVENOUS | Status: AC
  Administered 2016-10-12: 13 mg via INTRAVENOUS
  Filled 2016-10-12: qty 13

## 2016-10-12 MED ORDER — SODIUM CHLORIDE 0.9 % IV SOLN
10.0000 [IU]/m2 | Freq: Once | INTRAVENOUS | Status: AC
  Administered 2016-10-12: 21 [IU] via INTRAVENOUS
  Filled 2016-10-12: qty 7

## 2016-10-12 MED ORDER — SODIUM CHLORIDE 0.9 % IV SOLN
375.0000 mg/m2 | Freq: Once | INTRAVENOUS | Status: AC
  Administered 2016-10-12: 800 mg via INTRAVENOUS
  Filled 2016-10-12: qty 40

## 2016-10-12 MED ORDER — HEPARIN SOD (PORK) LOCK FLUSH 100 UNIT/ML IV SOLN
500.0000 [IU] | Freq: Once | INTRAVENOUS | Status: AC | PRN
  Administered 2016-10-12: 500 [IU]
  Filled 2016-10-12: qty 5

## 2016-10-12 MED ORDER — DOXORUBICIN HCL CHEMO IV INJECTION 2 MG/ML
25.0000 mg/m2 | Freq: Once | INTRAVENOUS | Status: AC
  Administered 2016-10-12: 54 mg via INTRAVENOUS
  Filled 2016-10-12: qty 27

## 2016-10-12 MED ORDER — SODIUM CHLORIDE 0.9 % IV SOLN
Freq: Once | INTRAVENOUS | Status: AC
  Administered 2016-10-12: 10:00:00 via INTRAVENOUS
  Filled 2016-10-12: qty 5

## 2016-10-12 MED ORDER — PALONOSETRON HCL INJECTION 0.25 MG/5ML
0.2500 mg | Freq: Once | INTRAVENOUS | Status: AC
  Administered 2016-10-12: 0.25 mg via INTRAVENOUS
  Filled 2016-10-12: qty 5

## 2016-10-12 NOTE — Progress Notes (Signed)
No blood return noted when accessing port today, flushes well with no resistance, okay to proceed with treatment today per Dr Rogue Bussing.

## 2016-10-25 ENCOUNTER — Inpatient Hospital Stay (HOSPITAL_BASED_OUTPATIENT_CLINIC_OR_DEPARTMENT_OTHER): Payer: BLUE CROSS/BLUE SHIELD | Admitting: Oncology

## 2016-10-25 ENCOUNTER — Inpatient Hospital Stay: Payer: BLUE CROSS/BLUE SHIELD | Attending: Oncology

## 2016-10-25 ENCOUNTER — Other Ambulatory Visit: Payer: Self-pay

## 2016-10-25 VITALS — BP 143/88 | HR 94 | Temp 97.4°F | Resp 16 | Wt 232.0 lb

## 2016-10-25 DIAGNOSIS — G629 Polyneuropathy, unspecified: Secondary | ICD-10-CM | POA: Insufficient documentation

## 2016-10-25 DIAGNOSIS — I10 Essential (primary) hypertension: Secondary | ICD-10-CM | POA: Diagnosis not present

## 2016-10-25 DIAGNOSIS — C8111 Nodular sclerosis classical Hodgkin lymphoma, lymph nodes of head, face, and neck: Secondary | ICD-10-CM

## 2016-10-25 DIAGNOSIS — Z87891 Personal history of nicotine dependence: Secondary | ICD-10-CM | POA: Insufficient documentation

## 2016-10-25 DIAGNOSIS — Z79899 Other long term (current) drug therapy: Secondary | ICD-10-CM | POA: Diagnosis not present

## 2016-10-25 DIAGNOSIS — R112 Nausea with vomiting, unspecified: Secondary | ICD-10-CM

## 2016-10-25 LAB — COMPREHENSIVE METABOLIC PANEL
ALBUMIN: 4.1 g/dL (ref 3.5–5.0)
ALK PHOS: 92 U/L (ref 38–126)
ALT: 62 U/L (ref 17–63)
AST: 36 U/L (ref 15–41)
Anion gap: 9 (ref 5–15)
BILIRUBIN TOTAL: 0.5 mg/dL (ref 0.3–1.2)
BUN: 16 mg/dL (ref 6–20)
CALCIUM: 8.8 mg/dL — AB (ref 8.9–10.3)
CO2: 24 mmol/L (ref 22–32)
Chloride: 104 mmol/L (ref 101–111)
Creatinine, Ser: 0.89 mg/dL (ref 0.61–1.24)
GFR calc Af Amer: >60 mL/min (ref >60)
GFR calc non Af Amer: >60 mL/min (ref >60)
GLUCOSE: 98 mg/dL (ref 65–99)
Potassium: 3.4 mmol/L — ABNORMAL LOW (ref 3.5–5.1)
Sodium: 137 mmol/L (ref 135–145)
TOTAL PROTEIN: 6.7 g/dL (ref 6.5–8.1)

## 2016-10-25 LAB — CBC WITH DIFFERENTIAL/PLATELET
BASOS ABS: 0.1 10*3/uL (ref 0–0.1)
BASOS PCT: 3 %
Eosinophils Absolute: 0.2 10*3/uL (ref 0–0.7)
Eosinophils Relative: 6 %
HEMATOCRIT: 32.1 % — AB (ref 40.0–52.0)
HEMOGLOBIN: 11.3 g/dL — AB (ref 13.0–18.0)
Lymphocytes Relative: 29 %
Lymphs Abs: 0.8 10*3/uL — ABNORMAL LOW (ref 1.0–3.6)
MCH: 29.5 pg (ref 26.0–34.0)
MCHC: 35.3 g/dL (ref 32.0–36.0)
MCV: 83.7 fL (ref 80.0–100.0)
MONOS PCT: 22 %
Monocytes Absolute: 0.6 10*3/uL (ref 0.2–1.0)
NEUTROS ABS: 1.1 10*3/uL — AB (ref 1.4–6.5)
NEUTROS PCT: 40 %
Platelets: 264 10*3/uL (ref 150–440)
RBC: 3.84 MIL/uL — ABNORMAL LOW (ref 4.40–5.90)
RDW: 21.1 % — ABNORMAL HIGH (ref 11.5–14.5)
WBC: 2.7 10*3/uL — ABNORMAL LOW (ref 3.8–10.6)

## 2016-10-25 NOTE — Progress Notes (Signed)
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 He continues to have a mild peripheral neuropathy that does not affect his day-to-day activity. He has increased nausea and vomiting on the day of his treatments, but otherwise is tolerating them well. He also complains of feeling fatigued for a few days after treatments. He complains of weight gain. He denies any fevers, night sweats, or weight loss. He has no other neurologic complaints. He denies any dysphagia or difficulty swallowing. He has no chest pain or shortness of breath. He denies any nausea, vomiting, constipation, or diarrhea. He has no urinary complaints. Patient offers no further specific complaints today.  REVIEW OF SYSTEMS:   Review of Systems  Constitutional: Negative for fever, malaise/fatigue and weight loss.       Complains of weight gain  HENT: Negative.   Respiratory: Negative.  Negative for cough and shortness of breath.   Cardiovascular: Negative.  Negative for chest pain and leg swelling.  Gastrointestinal: Positive for nausea and vomiting. Negative for abdominal pain.  Genitourinary: Negative.   Musculoskeletal: Negative.  Negative for neck pain.  Skin: Negative.  Negative for rash.  Neurological: Positive for sensory change and weakness.  Psychiatric/Behavioral: Negative.  The patient is not nervous/anxious.   All other systems reviewed and are negative.   As per HPI. Otherwise, a complete review of systems is negative.  PAST MEDICAL HISTORY: Past Medical History:  Diagnosis Date  . Medical history non-contributory     PAST  SURGICAL HISTORY: Past Surgical History:  Procedure Laterality Date  . LYMPH NODE BIOPSY Right 04/13/2016   Procedure: biopsy of lymph nodes open deep cervical node (right);  Surgeon: Margaretha Sheffield, MD;  Location: [facility];  Service: ENT;  Laterality: Right;  . NO PAST SURGERIES    . PORTA CATH INSERTION N/A 05/15/2016   Procedure: Glori Luis Cath Insertion;  Surgeon: Katha Cabal, MD;  Location: [facility];  Service: Cardiovascular;  Laterality: N/A;    FAMILY HISTORY: Family History  Problem Relation Age of Onset  . Heart disease Father   . COPD Father   . Hypertension Father   . Hypertension Brother   . Alzheimer's disease Maternal Grandmother   . Pancreatic cancer Maternal Grandfather     ADVANCED DIRECTIVES (Y/N):  N  HEALTH MAINTENANCE: Social History  Substance Use Topics  . Smoking status: Former Research scientist (life sciences)  . Smokeless tobacco: Former Systems developer    Types: Snuff    Quit date: 05/25/2015     Comment: social in college  . Alcohol use 1.2 oz/week    2 Glasses of wine per week     Colonoscopy:  PAP:  Bone density:  Lipid panel:  No Known Allergies  Current Outpatient Prescriptions  Medication Sig Dispense Refill  . acetaminophen (TYLENOL) 325 MG tablet Take 650 mg by mouth every 6 (six) hours as needed for moderate pain or headache.    . lidocaine-prilocaine (EMLA) cream Apply to affected area once 30 g 3  . ondansetron (ZOFRAN) 8 MG tablet Take 1 tablet (8 mg total) by mouth 2 (two) times daily as needed. 30 tablet 1  . prochlorperazine (COMPAZINE) 10  MG tablet Take 1 tablet (10 mg total) by mouth every 6 (six) hours as needed (Nausea or vomiting). 30 tablet 1   Current Facility-Administered Medications  Medication Dose Route Frequency Provider Last Rate Last Dose  . ceFAZolin (ANCEF) IVPB 1 g/50 mL premix  1 g Intravenous Once Schnier, Dolores Lory, MD       Facility-Administered Medications Ordered in Other Visits  Medication Dose Route Frequency  Provider Last Rate Last Dose  . sodium chloride flush (NS) 0.9 % injection 10 mL  10 mL Intracatheter PRN Lloyd Huger, MD   10 mL at 10/26/16 0935    OBJECTIVE: Vitals:   10/25/16 1519  BP: (!) 143/88  Pulse: 94  Resp: 16  Temp: (!) 97.4 F (36.3 C)     Body mass index is 35.28 kg/m.    ECOG FS:0 - Asymptomatic  General: Well-developed, well-nourished, no acute distress. Eyes: Pink conjunctiva, anicteric sclera. HEENT: Large right neck mass, resolved. Lungs: Clear to auscultation bilaterally. Heart: Regular rate and rhythm. No rubs, murmurs, or gallops. Abdomen: Soft, nontender, nondistended. No organomegaly noted, normoactive bowel sounds. Musculoskeletal: No edema, cyanosis, or clubbing. Neuro: Alert, answering all questions appropriately. Cranial nerves grossly intact. Skin: No rashes or petechiae noted. Psych: Normal affect.   LAB RESULTS:  Lab Results  Component Value Date   NA 137 10/25/2016   K 3.4 (L) 10/25/2016   CL 104 10/25/2016   CO2 24 10/25/2016   GLUCOSE 98 10/25/2016   BUN 16 10/25/2016   CREATININE 0.89 10/25/2016   CALCIUM 8.8 (L) 10/25/2016   PROT 6.7 10/25/2016   ALBUMIN 4.1 10/25/2016   AST 36 10/25/2016   ALT 62 10/25/2016   ALKPHOS 92 10/25/2016   BILITOT 0.5 10/25/2016   GFRNONAA >60 10/25/2016   GFRAA >60 10/25/2016    Lab Results  Component Value Date   WBC 2.7 (L) 10/25/2016   NEUTROABS 1.1 (L) 10/25/2016   HGB 11.3 (L) 10/25/2016   HCT 32.1 (L) 10/25/2016   MCV 83.7 10/25/2016   PLT 264 10/25/2016     STUDIES: No results found.  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 Bone marrow biopsy was negative. MUGA scan and PFTs are adequate to proceed with treatment. Repeat MUGA scan on Aug 07, 2016 reported ejection fraction of  61%. Patient appears to only need Neulasta on day 15 of every treatment. Patient will receive treatment on days 1 and 15 for a total of 6 cycles, provided his blood counts tolerate this schedule. Proceed with cycle 6, day 15 tomorrow. Patient will return to clinic in 2 weeks for further evaluation and consideration of cycle 6, day 15. He will have his PORT flushed in September, repeat PET scan in November and then see MD/labs with PET results. We will continue to follow patient with CT scans every 6 months for the first 2 years after completing treatment. 2. Neutropenia: Resolved. WBC today is 2.7. Neulasta today.  3. Peripheral neuropathy: Mild, monitor. 4. Hypertension: Blood pressure mildly elevated today, monitor. 5. Nausea and vomiting: Patient was instructed to take his at home prescriptions on the day of this treatment in addition to his premedications. 6. Weight gain: probably secondary to steroids. This is his last treatment. He states he wants to start working out.   Patient expressed understanding and was in agreement  with this plan. He also understands that He can call clinic at any time with any questions, concerns, or complaints.   Cancer Staging Nodular sclerosis Hodgkin lymphoma of lymph nodes of neck ([place]) Staging form: Hodgkin and Non-Hodgkin Lymphoma, AJCC 8th Edition - Clinical stage from 05/09/2016: Stage III (Hodgkin lymphoma, A - Asymptomatic) - Signed by Lloyd Huger, MD on 05/09/2016   Jacquelin Hawking, NP   10/26/2016 12:47 PM

## 2016-10-25 NOTE — Progress Notes (Signed)
Patient is here for a follow up.  Patient states no new concerns today. [place]

## 2016-10-26 ENCOUNTER — Inpatient Hospital Stay: Payer: BLUE CROSS/BLUE SHIELD

## 2016-10-26 VITALS — BP 149/91 | HR 81 | Temp 96.1°F | Resp 18

## 2016-10-26 DIAGNOSIS — C8111 Nodular sclerosis classical Hodgkin lymphoma, lymph nodes of head, face, and neck: Secondary | ICD-10-CM | POA: Diagnosis not present

## 2016-10-26 MED ORDER — DOXORUBICIN HCL CHEMO IV INJECTION 2 MG/ML
25.0000 mg/m2 | Freq: Once | INTRAVENOUS | Status: AC
  Administered 2016-10-26: 54 mg via INTRAVENOUS
  Filled 2016-10-26: qty 27

## 2016-10-26 MED ORDER — SODIUM CHLORIDE 0.9 % IV SOLN
Freq: Once | INTRAVENOUS | Status: AC
  Administered 2016-10-26: 10:00:00 via INTRAVENOUS
  Filled 2016-10-26: qty 5

## 2016-10-26 MED ORDER — PEGFILGRASTIM 6 MG/0.6ML ~~LOC~~ PSKT
6.0000 mg | PREFILLED_SYRINGE | Freq: Once | SUBCUTANEOUS | Status: AC
  Administered 2016-10-26: 6 mg via SUBCUTANEOUS
  Filled 2016-10-26: qty 0.6

## 2016-10-26 MED ORDER — VINBLASTINE SULFATE CHEMO INJECTION 1 MG/ML
13.0000 mg | Freq: Once | INTRAVENOUS | Status: AC
  Administered 2016-10-26: 13 mg via INTRAVENOUS
  Filled 2016-10-26: qty 13

## 2016-10-26 MED ORDER — HEPARIN SOD (PORK) LOCK FLUSH 100 UNIT/ML IV SOLN
500.0000 [IU] | Freq: Once | INTRAVENOUS | Status: AC | PRN
  Administered 2016-10-26: 500 [IU]
  Filled 2016-10-26: qty 5

## 2016-10-26 MED ORDER — SODIUM CHLORIDE 0.9 % IV SOLN
Freq: Once | INTRAVENOUS | Status: AC
  Administered 2016-10-26: 10:00:00 via INTRAVENOUS
  Filled 2016-10-26: qty 1000

## 2016-10-26 MED ORDER — SODIUM CHLORIDE 0.9 % IV SOLN
375.0000 mg/m2 | Freq: Once | INTRAVENOUS | Status: AC
  Administered 2016-10-26: 800 mg via INTRAVENOUS
  Filled 2016-10-26: qty 40

## 2016-10-26 MED ORDER — SODIUM CHLORIDE 0.9% FLUSH
10.0000 mL | INTRAVENOUS | Status: DC | PRN
  Administered 2016-10-26: 10 mL
  Filled 2016-10-26: qty 10

## 2016-10-26 MED ORDER — SODIUM CHLORIDE 0.9 % IV SOLN
10.0000 [IU]/m2 | Freq: Once | INTRAVENOUS | Status: AC
  Administered 2016-10-26: 21 [IU] via INTRAVENOUS
  Filled 2016-10-26: qty 7

## 2016-11-02 ENCOUNTER — Ambulatory Visit: Payer: BLUE CROSS/BLUE SHIELD

## 2016-12-14 ENCOUNTER — Inpatient Hospital Stay: Payer: BLUE CROSS/BLUE SHIELD

## 2016-12-18 ENCOUNTER — Inpatient Hospital Stay: Payer: BLUE CROSS/BLUE SHIELD | Attending: Oncology

## 2016-12-18 DIAGNOSIS — Z452 Encounter for adjustment and management of vascular access device: Secondary | ICD-10-CM | POA: Diagnosis not present

## 2016-12-18 DIAGNOSIS — C8111 Nodular sclerosis classical Hodgkin lymphoma, lymph nodes of head, face, and neck: Secondary | ICD-10-CM | POA: Insufficient documentation

## 2016-12-18 DIAGNOSIS — Z95828 Presence of other vascular implants and grafts: Secondary | ICD-10-CM

## 2016-12-18 MED ORDER — SODIUM CHLORIDE 0.9% FLUSH
10.0000 mL | INTRAVENOUS | Status: AC | PRN
  Administered 2016-12-18: 10 mL
  Filled 2016-12-18: qty 10

## 2016-12-18 MED ORDER — HEPARIN SOD (PORK) LOCK FLUSH 100 UNIT/ML IV SOLN
500.0000 [IU] | INTRAVENOUS | Status: AC | PRN
  Administered 2016-12-18: 500 [IU]

## 2016-12-24 ENCOUNTER — Encounter: Payer: Self-pay | Admitting: Primary Care

## 2016-12-24 ENCOUNTER — Ambulatory Visit (INDEPENDENT_AMBULATORY_CARE_PROVIDER_SITE_OTHER): Payer: BLUE CROSS/BLUE SHIELD | Admitting: Primary Care

## 2016-12-24 DIAGNOSIS — C8111 Nodular sclerosis classical Hodgkin lymphoma, lymph nodes of head, face, and neck: Secondary | ICD-10-CM | POA: Diagnosis not present

## 2016-12-24 DIAGNOSIS — R030 Elevated blood-pressure reading, without diagnosis of hypertension: Secondary | ICD-10-CM | POA: Diagnosis not present

## 2016-12-24 NOTE — Progress Notes (Signed)
Subjective:    Patient ID: Wyatt Bates, male    DOB: May 31, 1975, 41 y.o.   MRN: [id number]  HPI  Mr. Ruffolo is a 41 year old male who presents today for follow up.  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 Last follow up visit was in early August 2018. He finished up chemotherapy on August 3rd, last evaluation per the cancer center was on August 2nd. PET scan scheduled for November this year. Overall feeling well and denies nausea, fatigue on days without chemotherapy.   2) Elevated Blood Pressure: Persistently elevated with PCP and oncology office visits. He's checking his BP 2-3 times weekly at work with readings of 120's-70/80's. He is working on weight loss by eliminating sugar. He plans on starting an exercise program. He has re-gained some weight recently as he was on vacation for the past 2 weeks. He denies chest pain, shortness of breath, dizziness.  Wt Readings from Last 3 Encounters:  12/24/16 225 lb (102.1 kg)  10/25/16 232 lb (105.2 kg)  10/11/16 227 lb 14.4 oz (103.4 kg)     BP Readings from Last 3 Encounters:  12/24/16 (!) 148/94  10/26/16 (!) 149/91  10/25/16 (!) 143/88     Review of Systems  Constitutional: Negative for fatigue.  Respiratory: Negative for shortness of breath.   Cardiovascular: Negative for chest pain.  Gastrointestinal: Negative for nausea.  Neurological: Negative for dizziness and headaches.       Past Medical History:  Diagnosis Date  . Medical history non-contributory      Social History   Social History  . Marital status: Married    Spouse name: N/A  . Number of children: N/A  . Years of education: N/A   Occupational History  . Not on file.   Social History Main Topics  . Smoking status: Former Research scientist (life sciences)  . Smokeless tobacco: Former Systems developer    Types: Snuff    Quit date: 05/25/2015     Comment: social in college  . Alcohol use 1.2 oz/week    2 Glasses of wine per week  . Drug use: No  . Sexual activity: Not on file     Other Topics Concern  . Not on file   Social History Narrative   Married.   2 children.   Works in [employer].   Enjoys golf, baseball, hunting, spending time with family.    Past Surgical History:  Procedure Laterality Date  . LYMPH NODE BIOPSY Right 04/13/2016   Procedure: biopsy of lymph nodes open deep cervical node (right);  Surgeon: Margaretha Sheffield, MD;  Location: [facility];  Service: ENT;  Laterality: Right;  . NO PAST SURGERIES    . PORTA CATH INSERTION N/A 05/15/2016   Procedure: Glori Luis Cath Insertion;  Surgeon: Katha Cabal, MD;  Location: [facility];  Service: Cardiovascular;  Laterality: N/A;    Family History  Problem Relation Age of Onset  . Heart disease Father   . COPD Father   . Hypertension Father   . Hypertension Brother   . Alzheimer's disease Maternal Grandmother   . Pancreatic cancer Maternal Grandfather     No Known Allergies  Current Outpatient Prescriptions on File Prior to Visit  Medication Sig Dispense Refill  . lidocaine-prilocaine (EMLA) cream Apply to affected area once 30 g 3  . acetaminophen (TYLENOL) 325 MG tablet Take 650 mg by mouth every 6 (six) hours as needed for moderate pain or headache.    . ondansetron (ZOFRAN)  8 MG tablet Take 1 tablet (8 mg total) by mouth 2 (two) times daily as needed. (Patient not taking: Reported on 12/24/2016) 30 tablet 1  . prochlorperazine (COMPAZINE) 10 MG tablet Take 1 tablet (10 mg total) by mouth every 6 (six) hours as needed (Nausea or vomiting). (Patient not taking: Reported on 12/24/2016) 30 tablet 1   Current Facility-Administered Medications on File Prior to Visit  Medication Dose Route Frequency Provider Last Rate Last Dose  . ceFAZolin (ANCEF) IVPB 1 g/50 mL premix  1 g Intravenous Once Schnier, Dolores Lory, MD        BP (!) 148/94   Pulse 88   Temp 97.7 F (36.5 C) (Oral)   Wt 225 lb (102.1 kg)   SpO2 98%   BMI 34.21 kg/m    Objective:   Physical Exam  Constitutional: He  appears well-nourished.  Neck: Neck supple.  Cardiovascular: Normal rate and regular rhythm.   Pulmonary/Chest: Effort normal and breath sounds normal.  Skin: Skin is warm and dry. No erythema.  Psychiatric: He has a normal mood and affect.          Assessment & Plan:

## 2016-12-24 NOTE — Assessment & Plan Note (Addendum)
Above goal in the office today. Home readings of 120's/70-80's. Discussed the importance of a healthy diet and regular exercise in order for weight loss, and to reduce the risk of other medical problems. Continue to monitor BP, he will report readings at or above 135/90.

## 2016-12-24 NOTE — Assessment & Plan Note (Signed)
Completed chemotherapy on August 3rd. Overall doing well, PET scan due in November.

## 2016-12-24 NOTE — Patient Instructions (Signed)
Start exercising. You should be getting 150 minutes of moderate intensity exercise weekly.  Ensure you are consuming 64 ounces of water daily.  Limit intake of processed foods, sugary foods, fast/fried/fatty foods.  Continue to monitor your blood pressure, it should run less than 135/90. Please notify me if you notice readings at or above 135/90 on a consistent basis.   Please schedule a physical with me in 6 months. You may also schedule a lab only appointment 3-4 days prior. We will discuss your lab results in detail during your physical.  It was a pleasure to see you today!

## 2017-01-23 NOTE — Progress Notes (Signed)
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  He currently feels well and is asymptomatic. He denies any fevers, night sweats, or weight loss. He has no neurologic complaints. He denies any dysphagia or difficulty swallowing. He has no chest pain or shortness of breath. He denies any nausea, vomiting, constipation, or diarrhea. He has no urinary complaints. Patient offers no specific complaints today.  REVIEW OF SYSTEMS:   Review of Systems  Constitutional: Negative.  Negative for fever, malaise/fatigue and weight loss.  HENT: Negative.   Respiratory: Negative.  Negative for cough and shortness of breath.   Cardiovascular: Negative.  Negative for chest pain and leg swelling.  Gastrointestinal: Negative.  Negative for abdominal pain, nausea and vomiting.  Genitourinary: Negative.   Musculoskeletal: Negative.  Negative for neck pain.  Skin: Negative.  Negative for rash.  Neurological: Negative for sensory change and weakness.  Psychiatric/Behavioral: The patient is nervous/anxious.     As per HPI. Otherwise, a complete review of systems is negative.  PAST MEDICAL HISTORY: Past Medical History:  Diagnosis Date  . Medical history non-contributory     PAST SURGICAL HISTORY: Past Surgical History:  Procedure Laterality Date  . NO PAST SURGERIES      FAMILY HISTORY: Family History  Problem Relation Age of Onset  . Heart disease Father   . COPD Father   . Hypertension Father   . Hypertension Brother   . Alzheimer's disease Maternal Grandmother   . Pancreatic cancer Maternal Grandfather     ADVANCED  DIRECTIVES (Y/N):  N  HEALTH MAINTENANCE: Social History   Tobacco Use  . Smoking status: Former Research scientist (life sciences)  . Smokeless tobacco: Former Systems developer    Types: Snuff    Quit date: 05/25/2015  . Tobacco comment: social in college  Substance Use Topics  . Alcohol use: Yes    Alcohol/week: 1.2 oz    Types: 2 Glasses of wine per week  . Drug use: No     Colonoscopy:  PAP:  Bone density:  Lipid panel:  No Known Allergies  Current Outpatient Medications  Medication Sig Dispense Refill  . acetaminophen (TYLENOL) 325 MG tablet Take 650 mg by mouth every 6 (six) hours as needed for moderate pain or headache.    . lidocaine-prilocaine (EMLA) cream Apply to affected area once 30 g 3   Current Facility-Administered Medications  Medication Dose Route Frequency Provider Last Rate Last Dose  . ceFAZolin (ANCEF) IVPB 1 g/50 mL premix  1 g Intravenous Once Katha Cabal, MD        OBJECTIVE: Vitals:   01/29/17 1511  BP: (!) 149/86  Pulse: (!) 125  Resp: 20  Temp: (!) 97.1 F (36.2 C)     Body mass index is 34.24 kg/m.    ECOG FS:0 - Asymptomatic  General: Well-developed, well-nourished, no acute distress. Eyes: Pink conjunctiva, anicteric sclera. HEENT: Large right neck mass, resolved. Lungs: Clear to auscultation bilaterally. Heart: Regular rate and rhythm. No rubs, murmurs, or gallops. Abdomen: Soft, nontender, nondistended. No organomegaly noted, normoactive bowel sounds. Musculoskeletal: No edema, cyanosis, or clubbing. Neuro: Alert, answering all questions appropriately. Cranial nerves grossly intact. Skin: No rashes or petechiae  noted. Psych: Normal affect.   LAB RESULTS:  Lab Results  Component Value Date   NA 137 01/24/2017   K 3.8 01/24/2017   CL 105 01/24/2017   CO2 23 01/24/2017   GLUCOSE 105 (H) 01/24/2017   BUN 19 01/24/2017   CREATININE 0.83 01/24/2017   CALCIUM 9.2 01/24/2017   PROT 7.7 01/24/2017   ALBUMIN 4.5 01/24/2017   AST 34 01/24/2017   ALT 59  01/24/2017   ALKPHOS 111 01/24/2017   BILITOT 0.8 01/24/2017   GFRNONAA >60 01/24/2017   GFRAA >60 01/24/2017    Lab Results  Component Value Date   WBC 5.0 01/24/2017   NEUTROABS 3.1 01/24/2017   HGB 14.5 01/24/2017   HCT 42.1 01/24/2017   MCV 83.5 01/24/2017   PLT 227 01/24/2017     STUDIES: Nm Pet Image Restag (ps) Skull Base To Thigh  Result Date: 01/24/2017 CLINICAL DATA:  Subsequent treatment strategy for nodular sclerosing Hodgkin's lymphoma. EXAM: NUCLEAR MEDICINE PET SKULL BASE TO THIGH TECHNIQUE: 13.1 mCi F-18 FDG was injected intravenously. Full-ring PET imaging was performed from the skull base to thigh after the radiotracer. CT data was obtained and used for attenuation correction and anatomic localization. FASTING BLOOD GLUCOSE:  Value: 95 mg/dl COMPARISON:  04/25/2016 FINDINGS: NECK: Improved bulky right neck adenopathy when compared to prior study. Multi station lymph nodes have all improved since the prior examination. The intraparotid lymph nodes on the right side have completely resolved. The level 2 B lymph node on the prior study measured 4 cm and now measures 16 mm. Level 3 neck node on on the prior study measured 2.2 cm and now measures 15 mm. Level 4 lymph node on the prior study measured 15.5 mm and now measures 16.5 mm. No left-sided neck adenopathy. No hypermetabolism is demonstrated. The range of SUV max is between 2.45 and 2.60. SUV max on the prior study was approximately 16. CHEST: No residual mediastinal or hilar lymphadenopathy. No worrisome lung lesions. ABDOMEN/PELVIS: No abnormal hypermetabolic activity within the liver, pancreas, adrenal glands, or spleen. No hypermetabolic lymph nodes in the abdomen or pelvis. SKELETON: No significant findings. IMPRESSION: 1. Persistent enlarged multistation right neck nodes but significantly decreased in size since the prior CT scan and PET-CT and no hypermetabolism. 2. No disease identified in the chest, abdomen or pelvis.  Electronically Signed   By: Marijo Sanes M.D.   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  Previously bone marrow biopsy was negative.  Patient completed 6 cycles of ABVD on October 26, 2016.  No intervention is needed at this time.  Return to clinic in 6 months with repeat imaging and further evaluation.  Continue to follow patient with CT scans every 6 months for the first 2 years after completing treatment. 2. Neutropenia: Resolved.  3. Peripheral neuropathy: Patient does not complain of this today. 4. Hypertension: Blood pressure mildly elevated today, monitor.  Approximately 30 minutes was spent in discussion of which greater than 50% was consultation.  Patient expressed understanding and was in agreement with this plan. He also understands that He can call clinic at any time with any questions, concerns, or complaints.   Cancer Staging Nodular sclerosis Hodgkin lymphoma of lymph nodes of neck (  [place]) Staging form: Hodgkin and Non-Hodgkin Lymphoma, AJCC 8th Edition - Clinical stage from 05/09/2016: Stage III (Hodgkin lymphoma, A - Asymptomatic) - Signed by Lloyd Huger, MD on 05/09/2016   Lloyd Huger, MD   01/30/2017 1:55 PM

## 2017-01-24 ENCOUNTER — Ambulatory Visit
Admission: RE | Admit: 2017-01-24 | Discharge: 2017-01-24 | Disposition: A | Discharge status: Home / Self Care | Payer: BLUE CROSS/BLUE SHIELD | Source: Ambulatory Visit | Attending: Oncology | Admitting: Oncology

## 2017-01-24 ENCOUNTER — Inpatient Hospital Stay: Payer: BLUE CROSS/BLUE SHIELD | Attending: Oncology

## 2017-01-24 ENCOUNTER — Inpatient Hospital Stay: Payer: BLUE CROSS/BLUE SHIELD

## 2017-01-24 DIAGNOSIS — Z95828 Presence of other vascular implants and grafts: Secondary | ICD-10-CM

## 2017-01-24 DIAGNOSIS — Z01812 Encounter for preprocedural laboratory examination: Secondary | ICD-10-CM | POA: Insufficient documentation

## 2017-01-24 DIAGNOSIS — C8111 Nodular sclerosis classical Hodgkin lymphoma, lymph nodes of head, face, and neck: Secondary | ICD-10-CM

## 2017-01-24 DIAGNOSIS — Z800 Family history of malignant neoplasm of digestive organs: Secondary | ICD-10-CM | POA: Insufficient documentation

## 2017-01-24 DIAGNOSIS — I10 Essential (primary) hypertension: Secondary | ICD-10-CM | POA: Diagnosis not present

## 2017-01-24 DIAGNOSIS — Z87891 Personal history of nicotine dependence: Secondary | ICD-10-CM | POA: Diagnosis not present

## 2017-01-24 LAB — CBC WITH DIFFERENTIAL/PLATELET
BASOS ABS: 0 10*3/uL (ref 0–0.1)
BASOS PCT: 1 %
Eosinophils Absolute: 0.3 10*3/uL (ref 0–0.7)
Eosinophils Relative: 6 %
HEMATOCRIT: 42.1 % (ref 40.0–52.0)
HEMOGLOBIN: 14.5 g/dL (ref 13.0–18.0)
Lymphocytes Relative: 21 %
Lymphs Abs: 1 10*3/uL (ref 1.0–3.6)
MCH: 28.8 pg (ref 26.0–34.0)
MCHC: 34.5 g/dL (ref 32.0–36.0)
MCV: 83.5 fL (ref 80.0–100.0)
MONO ABS: 0.5 10*3/uL (ref 0.2–1.0)
Monocytes Relative: 10 %
NEUTROS ABS: 3.1 10*3/uL (ref 1.4–6.5)
NEUTROS PCT: 62 %
Platelets: 227 10*3/uL (ref 150–440)
RBC: 5.04 MIL/uL (ref 4.40–5.90)
RDW: 15.2 % — AB (ref 11.5–14.5)
WBC: 5 10*3/uL (ref 3.8–10.6)

## 2017-01-24 LAB — COMPREHENSIVE METABOLIC PANEL
ALBUMIN: 4.5 g/dL (ref 3.5–5.0)
ALT: 59 U/L (ref 17–63)
AST: 34 U/L (ref 15–41)
Alkaline Phosphatase: 111 U/L (ref 38–126)
Anion gap: 9 (ref 5–15)
BILIRUBIN TOTAL: 0.8 mg/dL (ref 0.3–1.2)
BUN: 19 mg/dL (ref 6–20)
CO2: 23 mmol/L (ref 22–32)
Calcium: 9.2 mg/dL (ref 8.9–10.3)
Chloride: 105 mmol/L (ref 101–111)
Creatinine, Ser: 0.83 mg/dL (ref 0.61–1.24)
GFR calc Af Amer: >60 mL/min (ref >60)
GFR calc non Af Amer: >60 mL/min (ref >60)
GLUCOSE: 105 mg/dL — AB (ref 65–99)
POTASSIUM: 3.8 mmol/L (ref 3.5–5.1)
Sodium: 137 mmol/L (ref 135–145)
TOTAL PROTEIN: 7.7 g/dL (ref 6.5–8.1)

## 2017-01-24 LAB — GLUCOSE, CAPILLARY: Glucose-Capillary: 95 mg/dL (ref 65–99)

## 2017-01-24 MED ORDER — SODIUM CHLORIDE 0.9% FLUSH
10.0000 mL | INTRAVENOUS | Status: AC | PRN
  Administered 2017-01-24: 10 mL
  Filled 2017-01-24: qty 10

## 2017-01-24 MED ORDER — FLUDEOXYGLUCOSE F - 18 (FDG) INJECTION
13.1100 | Freq: Once | INTRAVENOUS | Status: AC | PRN
  Administered 2017-01-24: 13.11 via INTRAVENOUS

## 2017-01-24 MED ORDER — HEPARIN SOD (PORK) LOCK FLUSH 100 UNIT/ML IV SOLN
500.0000 [IU] | INTRAVENOUS | Status: AC | PRN
  Administered 2017-01-24: 500 [IU]

## 2017-01-29 ENCOUNTER — Encounter: Payer: Self-pay | Admitting: Oncology

## 2017-01-29 ENCOUNTER — Inpatient Hospital Stay (HOSPITAL_BASED_OUTPATIENT_CLINIC_OR_DEPARTMENT_OTHER): Payer: BLUE CROSS/BLUE SHIELD | Admitting: Oncology

## 2017-01-29 VITALS — BP 149/86 | HR 125 | Temp 97.1°F | Resp 20 | Wt 225.2 lb

## 2017-01-29 DIAGNOSIS — Z800 Family history of malignant neoplasm of digestive organs: Secondary | ICD-10-CM

## 2017-01-29 DIAGNOSIS — Z87891 Personal history of nicotine dependence: Secondary | ICD-10-CM

## 2017-01-29 DIAGNOSIS — C8111 Nodular sclerosis classical Hodgkin lymphoma, lymph nodes of head, face, and neck: Secondary | ICD-10-CM | POA: Diagnosis not present

## 2017-01-29 DIAGNOSIS — I10 Essential (primary) hypertension: Secondary | ICD-10-CM | POA: Diagnosis not present

## 2017-01-29 NOTE — Progress Notes (Signed)
Patient denies any concerns today.  

## 2017-02-07 ENCOUNTER — Ambulatory Visit: Payer: BLUE CROSS/BLUE SHIELD | Admitting: Oncology

## 2017-04-29 ENCOUNTER — Encounter: Payer: Self-pay | Admitting: Oncology

## 2017-05-01 ENCOUNTER — Inpatient Hospital Stay: Payer: BLUE CROSS/BLUE SHIELD | Attending: Oncology

## 2017-05-01 DIAGNOSIS — Z452 Encounter for adjustment and management of vascular access device: Secondary | ICD-10-CM | POA: Diagnosis not present

## 2017-05-01 DIAGNOSIS — Z95828 Presence of other vascular implants and grafts: Secondary | ICD-10-CM

## 2017-05-01 DIAGNOSIS — C8111 Nodular sclerosis classical Hodgkin lymphoma, lymph nodes of head, face, and neck: Secondary | ICD-10-CM | POA: Diagnosis present

## 2017-05-01 MED ORDER — SODIUM CHLORIDE 0.9% FLUSH
10.0000 mL | INTRAVENOUS | Status: DC | PRN
  Administered 2017-05-01: 10 mL via INTRAVENOUS
  Filled 2017-05-01: qty 10

## 2017-05-01 MED ORDER — HEPARIN SOD (PORK) LOCK FLUSH 100 UNIT/ML IV SOLN
500.0000 [IU] | Freq: Once | INTRAVENOUS | Status: AC
  Administered 2017-05-01: 500 [IU] via INTRAVENOUS

## 2017-06-12 ENCOUNTER — Inpatient Hospital Stay: Payer: BLUE CROSS/BLUE SHIELD | Attending: Oncology

## 2017-06-12 DIAGNOSIS — Z95828 Presence of other vascular implants and grafts: Secondary | ICD-10-CM

## 2017-06-12 DIAGNOSIS — C8111 Nodular sclerosis classical Hodgkin lymphoma, lymph nodes of head, face, and neck: Secondary | ICD-10-CM | POA: Insufficient documentation

## 2017-06-12 DIAGNOSIS — Z452 Encounter for adjustment and management of vascular access device: Secondary | ICD-10-CM | POA: Diagnosis not present

## 2017-06-12 MED ORDER — SODIUM CHLORIDE 0.9% FLUSH
10.0000 mL | INTRAVENOUS | Status: AC | PRN
  Administered 2017-06-12: 10 mL
  Filled 2017-06-12: qty 10

## 2017-06-12 MED ORDER — HEPARIN SOD (PORK) LOCK FLUSH 100 UNIT/ML IV SOLN
500.0000 [IU] | INTRAVENOUS | Status: AC | PRN
  Administered 2017-06-12: 500 [IU]

## 2017-06-20 ENCOUNTER — Other Ambulatory Visit: Payer: BLUE CROSS/BLUE SHIELD

## 2017-06-24 ENCOUNTER — Encounter: Payer: BLUE CROSS/BLUE SHIELD | Admitting: Primary Care

## 2017-07-26 ENCOUNTER — Other Ambulatory Visit: Payer: Self-pay | Admitting: *Deleted

## 2017-07-26 DIAGNOSIS — C8590 Non-Hodgkin lymphoma, unspecified, unspecified site: Secondary | ICD-10-CM

## 2017-07-26 NOTE — Progress Notes (Signed)
cbc

## 2017-07-29 ENCOUNTER — Inpatient Hospital Stay: Payer: BLUE CROSS/BLUE SHIELD | Attending: Oncology

## 2017-07-29 ENCOUNTER — Ambulatory Visit
Admission: RE | Admit: 2017-07-29 | Discharge: 2017-07-29 | Disposition: A | Discharge status: Home / Self Care | Payer: BLUE CROSS/BLUE SHIELD | Source: Ambulatory Visit | Attending: Oncology | Admitting: Oncology

## 2017-07-29 DIAGNOSIS — C8110 Nodular sclerosis classical Hodgkin lymphoma, unspecified site: Secondary | ICD-10-CM | POA: Diagnosis present

## 2017-07-29 DIAGNOSIS — Z79899 Other long term (current) drug therapy: Secondary | ICD-10-CM | POA: Diagnosis not present

## 2017-07-29 DIAGNOSIS — N281 Cyst of kidney, acquired: Secondary | ICD-10-CM | POA: Diagnosis not present

## 2017-07-29 DIAGNOSIS — Z800 Family history of malignant neoplasm of digestive organs: Secondary | ICD-10-CM | POA: Insufficient documentation

## 2017-07-29 DIAGNOSIS — Z95828 Presence of other vascular implants and grafts: Secondary | ICD-10-CM

## 2017-07-29 DIAGNOSIS — Z87891 Personal history of nicotine dependence: Secondary | ICD-10-CM | POA: Diagnosis not present

## 2017-07-29 DIAGNOSIS — C8111 Nodular sclerosis classical Hodgkin lymphoma, lymph nodes of head, face, and neck: Secondary | ICD-10-CM

## 2017-07-29 DIAGNOSIS — F419 Anxiety disorder, unspecified: Secondary | ICD-10-CM | POA: Insufficient documentation

## 2017-07-29 DIAGNOSIS — Z8572 Personal history of non-Hodgkin lymphomas: Secondary | ICD-10-CM | POA: Diagnosis present

## 2017-07-29 DIAGNOSIS — C8590 Non-Hodgkin lymphoma, unspecified, unspecified site: Secondary | ICD-10-CM

## 2017-07-29 LAB — COMPREHENSIVE METABOLIC PANEL
ALBUMIN: 4.4 g/dL (ref 3.5–5.0)
ALK PHOS: 108 U/L (ref 38–126)
ALT: 42 U/L (ref 17–63)
AST: 28 U/L (ref 15–41)
Anion gap: 10 (ref 5–15)
BILIRUBIN TOTAL: 0.8 mg/dL (ref 0.3–1.2)
BUN: 19 mg/dL (ref 6–20)
CALCIUM: 9 mg/dL (ref 8.9–10.3)
CO2: 21 mmol/L — ABNORMAL LOW (ref 22–32)
CREATININE: 0.98 mg/dL (ref 0.61–1.24)
Chloride: 106 mmol/L (ref 101–111)
GFR calc Af Amer: >60 mL/min (ref >60)
GLUCOSE: 98 mg/dL (ref 65–99)
POTASSIUM: 4.1 mmol/L (ref 3.5–5.1)
Sodium: 137 mmol/L (ref 135–145)
TOTAL PROTEIN: 7.7 g/dL (ref 6.5–8.1)

## 2017-07-29 LAB — CBC WITH DIFFERENTIAL/PLATELET
BASOS ABS: 0.1 10*3/uL (ref 0–0.1)
Basophils Relative: 1 %
EOS PCT: 6 %
Eosinophils Absolute: 0.3 10*3/uL (ref 0–0.7)
HCT: 41.5 % (ref 40.0–52.0)
Hemoglobin: 14.7 g/dL (ref 13.0–18.0)
LYMPHS PCT: 27 %
Lymphs Abs: 1.5 10*3/uL (ref 1.0–3.6)
MCH: 29.9 pg (ref 26.0–34.0)
MCHC: 35.4 g/dL (ref 32.0–36.0)
MCV: 84.6 fL (ref 80.0–100.0)
MONO ABS: 0.5 10*3/uL (ref 0.2–1.0)
Monocytes Relative: 9 %
Neutro Abs: 3.2 10*3/uL (ref 1.4–6.5)
Neutrophils Relative %: 57 %
PLATELETS: 225 10*3/uL (ref 150–440)
RBC: 4.91 MIL/uL (ref 4.40–5.90)
RDW: 14.1 % (ref 11.5–14.5)
WBC: 5.6 10*3/uL (ref 3.8–10.6)

## 2017-07-29 MED ORDER — SODIUM CHLORIDE 0.9% FLUSH
10.0000 mL | INTRAVENOUS | Status: AC | PRN
  Filled 2017-07-29: qty 10

## 2017-07-29 MED ORDER — HEPARIN SOD (PORK) LOCK FLUSH 100 UNIT/ML IV SOLN: 500.0000 [IU] | Freq: Once | INTRAVENOUS | Status: AC

## 2017-07-29 MED ORDER — IOPAMIDOL (ISOVUE-370) INJECTION 76%
100.0000 mL | Freq: Once | INTRAVENOUS | Status: AC | PRN
  Administered 2017-07-29: 100 mL via INTRAVENOUS

## 2017-07-29 NOTE — Progress Notes (Unsigned)
Patient preference to have labs done peripherally instead of through his port.  Plans to have port removed this month.

## 2017-07-29 NOTE — Progress Notes (Signed)
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  He continues to be anxious, but otherwise feels well.  He denies any fevers, night sweats, or weight loss. He has no neurologic complaints. He denies any dysphagia or difficulty swallowing. He has no chest pain or shortness of breath. He denies any nausea, vomiting, constipation, or diarrhea. He has no urinary complaints.  Patient offers no further specific complaints today.  REVIEW OF SYSTEMS:   Review of Systems  Constitutional: Negative.  Negative for fever, malaise/fatigue and weight loss.  HENT: Negative.   Respiratory: Negative.  Negative for cough and shortness of breath.   Cardiovascular: Negative.  Negative for chest pain and leg swelling.  Gastrointestinal: Negative.  Negative for abdominal pain, nausea and vomiting.  Genitourinary: Negative.   Musculoskeletal: Negative.  Negative for neck pain.  Skin: Negative.  Negative for rash.  Neurological: Negative for sensory change and weakness.  Psychiatric/Behavioral: The patient is nervous/anxious.     As per HPI. Otherwise, a complete review of systems is negative.  PAST MEDICAL HISTORY: Past Medical History:  Diagnosis Date  . Cancer ([place])   . Medical history non-contributory     PAST SURGICAL HISTORY: Past Surgical History:  Procedure Laterality Date  . LYMPH NODE BIOPSY Right 04/13/2016   Procedure: biopsy of lymph nodes open deep cervical node (right);  Surgeon: Margaretha Sheffield, MD;  Location: [facility];  Service: ENT;  Laterality: Right;  . NO PAST SURGERIES    . PORTA CATH INSERTION N/A  05/15/2016   Procedure: Glori Luis Cath Insertion;  Surgeon: Katha Cabal, MD;  Location: [facility];  Service: Cardiovascular;  Laterality: N/A;    FAMILY HISTORY: Family History  Problem Relation Age of Onset  . Heart disease Father   . COPD Father   . Hypertension Father   . Hypertension Brother   . Alzheimer's disease Maternal Grandmother   . Pancreatic cancer Maternal Grandfather     ADVANCED DIRECTIVES (Y/N):  N  HEALTH MAINTENANCE: Social History   Tobacco Use  . Smoking status: Former Research scientist (life sciences)  . Smokeless tobacco: Former Systems developer    Types: Snuff    Quit date: 05/25/2015  . Tobacco comment: social in college  Substance Use Topics  . Alcohol use: Yes    Alcohol/week: 1.2 oz    Types: 2 Glasses of wine per week  . Drug use: No     Colonoscopy:  PAP:  Bone density:  Lipid panel:  No Known Allergies  Current Outpatient Medications  Medication Sig Dispense Refill  . acetaminophen (TYLENOL) 325 MG tablet Take 650 mg by mouth every 6 (six) hours as needed for moderate pain or headache.    . lidocaine-prilocaine (EMLA) cream Apply to affected area once (Patient not taking: Reported on 08/01/2017) 30 g 3   Current Facility-Administered Medications  Medication Dose Route Frequency Provider Last Rate Last Dose  . ceFAZolin (ANCEF) IVPB 1 g/50 mL premix  1 g Intravenous Once Schnier, Dolores Lory, MD       Facility-Administered Medications Ordered in Other Visits  Medication Dose Route Frequency Provider Last Rate Last Dose  . heparin lock flush 100 unit/mL  500  Units Intravenous Once Lloyd Huger, MD      . sodium chloride flush (NS) 0.9 % injection 10 mL  10 mL Intravenous PRN Lloyd Huger, MD        OBJECTIVE: Vitals:   08/01/17 1419  BP: (!) 166/100  Pulse: (!) 108  Resp: 18  Temp: (!) 96.9 F (36.1 C)     Body mass index is 33.94 kg/m.    ECOG FS:0 - Asymptomatic  General: Well-developed, well-nourished, no acute distress. Eyes: Pink  conjunctiva, anicteric sclera. HEENT: Normocephalic, moist mucous membranes, clear oropharnyx.  No palpable lymphadenopathy. Lungs: Clear to auscultation bilaterally. Heart: Regular rate and rhythm. No rubs, murmurs, or gallops. Abdomen: Soft, nontender, nondistended. No organomegaly noted, normoactive bowel sounds. Musculoskeletal: No edema, cyanosis, or clubbing. Neuro: Alert, answering all questions appropriately. Cranial nerves grossly intact. Skin: No rashes or petechiae noted. Psych: Normal affect. Lymphatics: No cervical, calvicular, axillary or inguinal LAD.   LAB RESULTS:  Lab Results  Component Value Date   NA 137 07/29/2017   K 4.1 07/29/2017   CL 106 07/29/2017   CO2 21 (L) 07/29/2017   GLUCOSE 98 07/29/2017   BUN 19 07/29/2017   CREATININE 0.98 07/29/2017   CALCIUM 9.0 07/29/2017   PROT 7.7 07/29/2017   ALBUMIN 4.4 07/29/2017   AST 28 07/29/2017   ALT 42 07/29/2017   ALKPHOS 108 07/29/2017   BILITOT 0.8 07/29/2017   GFRNONAA >60 07/29/2017   GFRAA >60 07/29/2017    Lab Results  Component Value Date   WBC 5.6 07/29/2017   NEUTROABS 3.2 07/29/2017   HGB 14.7 07/29/2017   HCT 41.5 07/29/2017   MCV 84.6 07/29/2017   PLT 225 07/29/2017     STUDIES: Ct Soft Tissue Neck W Contrast  Result Date: 07/29/2017 CLINICAL DATA:  Nodular sclerosis Hodgkin's lymphoma EXAM: CT NECK WITH CONTRAST TECHNIQUE: Multidetector CT imaging of the neck was performed using the standard protocol following the bolus administration of intravenous contrast. CONTRAST:  146m ISOVUE-370 IOPAMIDOL (ISOVUE-370) INJECTION 76% COMPARISON:  None. FINDINGS: Pharynx and larynx: No thickening of Waldeyer's ring. Salivary glands: No inflammation, mass, or stone. Thyroid: Normal. Lymph nodes: Enlargement of low jugular and posterior triangle lymph nodes on the right. Index node in the right jugular chain at the level of the larynx measures 16 mm short axis, stable. Another prominent and just inferior  node measures 11 mm short axis, also stable. Posterior triangle lymph node on 3:62 measures 6-7 mm short axis, stable. Vascular: Porta catheter on the left, unremarkable. Limited intracranial: Negative Visualized orbits: Negative. No mass or visible lacrimal gland thickening Mastoids and visualized paranasal sinuses: Clear Skeleton: Negative Upper chest: Reported separately IMPRESSION: Stable enlargement of right neck nodes when compared to 01/24/2017 PET-CT. No new finding. Electronically Signed   By: JMonte FantasiaM.D.   On: 07/29/2017 14:45   Ct Chest W Contrast  Result Date: 07/30/2017 CLINICAL DATA:  Patient with history of Hodgkin's lymphoma. Follow-up evaluation. EXAM: CT CHEST, ABDOMEN, AND PELVIS WITH CONTRAST TECHNIQUE: Multidetector CT imaging of the chest, abdomen and pelvis was performed following the standard protocol during bolus administration of intravenous contrast. CONTRAST:  103mISOVUE-370 IOPAMIDOL (ISOVUE-370) INJECTION 76% COMPARISON:  PET-CT 01/24/2017 FINDINGS: CT CHEST FINDINGS Cardiovascular: Left anterior chest wall Port-A-Cath is present with tip terminating in the superior vena cava. Normal heart size. No pericardial effusion. Mediastinum/Nodes: No enlarged axillary, mediastinal or hilar lymphadenopathy. Normal morphology of the esophagus. Lungs/Pleura: Central airways are patent. No large area of pulmonary  consolidation. No pleural effusion or pneumothorax. Musculoskeletal: No aggressive or acute appearing osseous lesions. CT ABDOMEN PELVIS FINDINGS Hepatobiliary: Liver is normal in size and contour. No focal hepatic lesions identified. Gallbladder is unremarkable. No intrahepatic or extrahepatic biliary ductal dilatation. Pancreas: Unremarkable Spleen: Unremarkable Adrenals/Urinary Tract: Adrenal glands are normal. 2.4 cm cyst superior pole right kidney. No hydronephrosis. Urinary bladder is unremarkable. Stomach/Bowel: Oral contrast material to the level of the rectum. Normal  appendix. No evidence for small bowel obstruction. No free fluid or free intraperitoneal air. Normal morphology of the stomach. Vascular/Lymphatic: Normal caliber abdominal aorta. Stable subcentimeter retroperitoneal lymph nodes. Reproductive: Dystrophic calcifications in the prostate. Other: None. Musculoskeletal: Lumbar spine degenerative changes. No aggressive or acute appearing osseous lesions. IMPRESSION: No adenopathy identified within the chest, abdomen or pelvis. See dedicated neck CT for neck findings. Electronically Signed   By: Lovey Newcomer M.D.   On: 07/30/2017 09:41   Ct Abdomen Pelvis W Contrast  Result Date: 07/30/2017 CLINICAL DATA:  Patient with history of Hodgkin's lymphoma. Follow-up evaluation. EXAM: CT CHEST, ABDOMEN, AND PELVIS WITH CONTRAST TECHNIQUE: Multidetector CT imaging of the chest, abdomen and pelvis was performed following the standard protocol during bolus administration of intravenous contrast. CONTRAST:  13m ISOVUE-370 IOPAMIDOL (ISOVUE-370) INJECTION 76% COMPARISON:  PET-CT 01/24/2017 FINDINGS: CT CHEST FINDINGS Cardiovascular: Left anterior chest wall Port-A-Cath is present with tip terminating in the superior vena cava. Normal heart size. No pericardial effusion. Mediastinum/Nodes: No enlarged axillary, mediastinal or hilar lymphadenopathy. Normal morphology of the esophagus. Lungs/Pleura: Central airways are patent. No large area of pulmonary consolidation. No pleural effusion or pneumothorax. Musculoskeletal: No aggressive or acute appearing osseous lesions. CT ABDOMEN PELVIS FINDINGS Hepatobiliary: Liver is normal in size and contour. No focal hepatic lesions identified. Gallbladder is unremarkable. No intrahepatic or extrahepatic biliary ductal dilatation. Pancreas: Unremarkable Spleen: Unremarkable Adrenals/Urinary Tract: Adrenal glands are normal. 2.4 cm cyst superior pole right kidney. No hydronephrosis. Urinary bladder is unremarkable. Stomach/Bowel: Oral contrast  material to the level of the rectum. Normal appendix. No evidence for small bowel obstruction. No free fluid or free intraperitoneal air. Normal morphology of the stomach. Vascular/Lymphatic: Normal caliber abdominal aorta. Stable subcentimeter retroperitoneal lymph nodes. Reproductive: Dystrophic calcifications in the prostate. Other: None. Musculoskeletal: Lumbar spine degenerative changes. No aggressive or acute appearing osseous lesions. IMPRESSION: No adenopathy identified within the chest, abdomen or pelvis. See dedicated neck CT for neck findings. Electronically Signed   By: DLovey NewcomerM.D.   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  Previously bone marrow biopsy was negative.  Patient completed 6 cycles of ABVD on October 26, 2016.  No intervention is needed at this time.  Secondary to cost, patient has requested that we only monitor CT of the neck since this was his primary site of disease.  Return to clinic in 6 months with repeat laboratory work and further evaluation. 2.  Hypertension: Patient's blood pressure significantly elevated today which she attributes to anxiety.  Continue monitoring and treatment per PCP.    Approximately 30 minutes was spent in discussion of which greater than 50% was consultation.   Patient expressed understanding and was in agreement with this plan. He also understands that He can call clinic at any  time with any questions, concerns, or complaints.   Cancer Staging Nodular sclerosis Hodgkin lymphoma of lymph nodes of neck (HCC) Staging form: Hodgkin and Non-Hodgkin Lymphoma, AJCC 8th Edition - Clinical stage from 05/09/2016: Stage III (Hodgkin lymphoma, A - Asymptomatic) -  Signed by Lloyd Huger, MD on 05/09/2016   Lloyd Huger, MD   08/05/2017 10:57 AM

## 2017-08-01 ENCOUNTER — Inpatient Hospital Stay (HOSPITAL_BASED_OUTPATIENT_CLINIC_OR_DEPARTMENT_OTHER): Payer: BLUE CROSS/BLUE SHIELD | Admitting: Oncology

## 2017-08-01 ENCOUNTER — Other Ambulatory Visit: Payer: Self-pay

## 2017-08-01 VITALS — BP 166/100 | HR 108 | Temp 96.9°F | Resp 18 | Wt 223.2 lb

## 2017-08-01 DIAGNOSIS — Z79899 Other long term (current) drug therapy: Secondary | ICD-10-CM

## 2017-08-01 DIAGNOSIS — Z800 Family history of malignant neoplasm of digestive organs: Secondary | ICD-10-CM

## 2017-08-01 DIAGNOSIS — F419 Anxiety disorder, unspecified: Secondary | ICD-10-CM | POA: Diagnosis not present

## 2017-08-01 DIAGNOSIS — Z87891 Personal history of nicotine dependence: Secondary | ICD-10-CM

## 2017-08-01 DIAGNOSIS — N281 Cyst of kidney, acquired: Secondary | ICD-10-CM

## 2017-08-01 DIAGNOSIS — C8111 Nodular sclerosis classical Hodgkin lymphoma, lymph nodes of head, face, and neck: Secondary | ICD-10-CM

## 2017-08-01 DIAGNOSIS — Z8572 Personal history of non-Hodgkin lymphomas: Secondary | ICD-10-CM | POA: Diagnosis not present

## 2017-08-01 NOTE — Progress Notes (Signed)
Here for follow up. Per pt " I feel fine" BP - 166/100  p 108-per pt very anxious- will repeat.  Dr Grayland Ormond informed  Repeat vs @1445   -pt stating his is less anxious but has always had " white coat syndrome " BP 171/119  p 89 -pt asymptomatic and stated he will seek medical attention if it remains elevated. Takes own VS @ home.  Dr Grayland Ormond informed.

## 2017-11-06 ENCOUNTER — Other Ambulatory Visit: Payer: Self-pay | Admitting: Primary Care

## 2017-11-06 DIAGNOSIS — Z0000 Encounter for general adult medical examination without abnormal findings: Secondary | ICD-10-CM

## 2017-11-11 ENCOUNTER — Encounter: Payer: Self-pay | Admitting: Oncology

## 2017-11-14 ENCOUNTER — Inpatient Hospital Stay: Payer: BLUE CROSS/BLUE SHIELD | Attending: Oncology

## 2017-11-14 DIAGNOSIS — C8111 Nodular sclerosis classical Hodgkin lymphoma, lymph nodes of head, face, and neck: Secondary | ICD-10-CM | POA: Diagnosis not present

## 2017-11-14 DIAGNOSIS — Z452 Encounter for adjustment and management of vascular access device: Secondary | ICD-10-CM | POA: Diagnosis not present

## 2017-11-14 DIAGNOSIS — Z95828 Presence of other vascular implants and grafts: Secondary | ICD-10-CM

## 2017-11-14 MED ORDER — SODIUM CHLORIDE 0.9% FLUSH
10.0000 mL | INTRAVENOUS | Status: DC | PRN
  Administered 2017-11-14: 10 mL via INTRAVENOUS
  Filled 2017-11-14: qty 10

## 2017-11-14 MED ORDER — HEPARIN SOD (PORK) LOCK FLUSH 100 UNIT/ML IV SOLN
500.0000 [IU] | Freq: Once | INTRAVENOUS | Status: AC
  Administered 2017-11-14: 500 [IU] via INTRAVENOUS
  Filled 2017-11-14: qty 5

## 2017-11-15 ENCOUNTER — Other Ambulatory Visit: Payer: BLUE CROSS/BLUE SHIELD

## 2017-11-20 ENCOUNTER — Encounter: Payer: BLUE CROSS/BLUE SHIELD | Admitting: Primary Care

## 2018-01-30 ENCOUNTER — Inpatient Hospital Stay: Payer: BLUE CROSS/BLUE SHIELD | Attending: Oncology

## 2018-01-30 ENCOUNTER — Ambulatory Visit
Admission: RE | Admit: 2018-01-30 | Discharge: 2018-01-30 | Disposition: A | Discharge status: Home / Self Care | Payer: BLUE CROSS/BLUE SHIELD | Source: Ambulatory Visit | Attending: Oncology | Admitting: Oncology

## 2018-01-30 DIAGNOSIS — Z87891 Personal history of nicotine dependence: Secondary | ICD-10-CM | POA: Insufficient documentation

## 2018-01-30 DIAGNOSIS — C8111 Nodular sclerosis classical Hodgkin lymphoma, lymph nodes of head, face, and neck: Secondary | ICD-10-CM | POA: Insufficient documentation

## 2018-01-30 DIAGNOSIS — F419 Anxiety disorder, unspecified: Secondary | ICD-10-CM | POA: Insufficient documentation

## 2018-01-30 DIAGNOSIS — Z800 Family history of malignant neoplasm of digestive organs: Secondary | ICD-10-CM | POA: Insufficient documentation

## 2018-01-30 DIAGNOSIS — Z0000 Encounter for general adult medical examination without abnormal findings: Secondary | ICD-10-CM

## 2018-01-30 DIAGNOSIS — Z79899 Other long term (current) drug therapy: Secondary | ICD-10-CM | POA: Diagnosis not present

## 2018-01-30 DIAGNOSIS — I10 Essential (primary) hypertension: Secondary | ICD-10-CM | POA: Diagnosis not present

## 2018-01-30 HISTORY — DX: Nodular sclerosis Hodgkin lymphoma, lymph nodes of head, face, and neck: C81.11

## 2018-01-30 LAB — COMPREHENSIVE METABOLIC PANEL
ALBUMIN: 4.8 g/dL (ref 3.5–5.0)
ALK PHOS: 95 U/L (ref 38–126)
ALT: 56 U/L — AB (ref 0–44)
AST: 31 U/L (ref 15–41)
Anion gap: 7 (ref 5–15)
BUN: 17 mg/dL (ref 6–20)
CHLORIDE: 106 mmol/L (ref 98–111)
CO2: 26 mmol/L (ref 22–32)
CREATININE: 0.82 mg/dL (ref 0.61–1.24)
Calcium: 9.4 mg/dL (ref 8.9–10.3)
GFR calc Af Amer: >60 mL/min (ref >60)
GFR calc non Af Amer: >60 mL/min (ref >60)
GLUCOSE: 88 mg/dL (ref 70–99)
Potassium: 3.8 mmol/L (ref 3.5–5.1)
SODIUM: 139 mmol/L (ref 135–145)
Total Bilirubin: 0.7 mg/dL (ref 0.3–1.2)
Total Protein: 8.1 g/dL (ref 6.5–8.1)

## 2018-01-30 LAB — CBC WITH DIFFERENTIAL/PLATELET
Abs Immature Granulocytes: 0.02 10*3/uL (ref 0.00–0.07)
Basophils Absolute: 0.1 10*3/uL (ref 0.0–0.1)
Basophils Relative: 1 %
EOS ABS: 0.3 10*3/uL (ref 0.0–0.5)
Eosinophils Relative: 4 %
HEMATOCRIT: 44 % (ref 39.0–52.0)
HEMOGLOBIN: 14.9 g/dL (ref 13.0–17.0)
IMMATURE GRANULOCYTES: 0 %
LYMPHS ABS: 1.8 10*3/uL (ref 0.7–4.0)
LYMPHS PCT: 29 %
MCH: 29.3 pg (ref 26.0–34.0)
MCHC: 33.9 g/dL (ref 30.0–36.0)
MCV: 86.4 fL (ref 80.0–100.0)
MONOS PCT: 9 %
Monocytes Absolute: 0.6 10*3/uL (ref 0.1–1.0)
NEUTROS PCT: 57 %
Neutro Abs: 3.4 10*3/uL (ref 1.7–7.7)
Platelets: 215 10*3/uL (ref 150–400)
RBC: 5.09 MIL/uL (ref 4.22–5.81)
RDW: 12.2 % (ref 11.5–15.5)
WBC: 6.1 10*3/uL (ref 4.0–10.5)
nRBC: 0 % (ref 0.0–0.2)

## 2018-01-30 MED ORDER — IOPAMIDOL (ISOVUE-300) INJECTION 61%
75.0000 mL | Freq: Once | INTRAVENOUS | Status: AC | PRN
  Administered 2018-01-30: 75 mL via INTRAVENOUS

## 2018-02-02 NOTE — Progress Notes (Addendum)
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  He continues to be anxious, but otherwise feels well.  He denies any fevers, night sweats, or weight loss. He has no neurologic complaints. He denies any dysphagia or difficulty swallowing. He has no chest pain or shortness of breath. He denies any nausea, vomiting, constipation, or diarrhea. He has no urinary complaints.  Patient feels at his baseline offers no further specific complaints today.  REVIEW OF SYSTEMS:   Review of Systems  Constitutional: Negative.  Negative for fever, malaise/fatigue and weight loss.  HENT: Negative.   Respiratory: Negative.  Negative for cough and shortness of breath.   Cardiovascular: Negative.  Negative for chest pain and leg swelling.  Gastrointestinal: Negative.  Negative for abdominal pain, nausea and vomiting.  Genitourinary: Negative.   Musculoskeletal: Negative.  Negative for neck pain.  Skin: Negative.  Negative for rash.  Neurological: Negative for sensory change and weakness.  Psychiatric/Behavioral: The patient is nervous/anxious.     As per HPI. Otherwise, a complete review of systems is negative.  PAST MEDICAL HISTORY: Past Medical History:  Diagnosis Date  . Medical history non-contributory   . Nodular sclerosis Hodgkin lymphoma [place]   Chemo tx's.    PAST SURGICAL HISTORY: Past Surgical History:  Procedure Laterality Date  . LYMPH NODE BIOPSY Right 04/13/2016   Procedure: biopsy of lymph nodes open deep cervical node (right);  Surgeon: PMargaretha Sheffield MD;  [facility]  Service: E[facility];  Laterality: Right;  . NO PAST SURGERIES    . PORTA CATH INSERTION N/A 05/15/2016   Procedure: PGlori LuisCath Insertion;  Surgeon: GKatha Cabal MD;  [facility];  Service: Cardiovascular;  Laterality: N/A;    FAMILY HISTORY: Family History  Problem Relation Age of Onset  . Heart disease Father   . COPD Father   . Hypertension Father   . Hypertension Brother   . Alzheimer's disease Maternal Grandmother   . Pancreatic cancer Maternal Grandfather     ADVANCED DIRECTIVES (Y/N):  N  HEALTH MAINTENANCE: Social History   Tobacco Use  . Smoking status: Former SResearch scientist (life sciences) . Smokeless tobacco: Former USystems developer   Types: Snuff    Quit date: 05/25/2015  . Tobacco comment: social in college  Substance Use Topics  . Alcohol use: Yes    Alcohol/week: 2.0 standard drinks    Types: 2 Glasses of wine per week  . Drug use: No     Colonoscopy:  PAP:  Bone density:  Lipid panel:  No Known Allergies  Current Outpatient Medications  Medication Sig Dispense Refill  . acetaminophen (TYLENOL) 325 MG tablet Take 650 mg by mouth every 6 (six) hours as needed for moderate pain or headache.    . lidocaine-prilocaine (EMLA) cream Apply to affected area once 30 g 3   Current Facility-Administered Medications  Medication Dose Route Frequency Provider Last Rate Last Dose  . ceFAZolin (ANCEF) IVPB 1 g/50 mL premix  1 g Intravenous Once Schnier, GDolores Lory MD       Facility-Administered Medications Ordered in Other Visits  Medication Dose Route Frequency Provider Last  Rate Last Dose  . heparin lock flush 100 unit/mL  500 Units Intravenous Once Lloyd Huger, MD      . sodium chloride flush (NS) 0.9 % injection 10 mL  10 mL Intravenous PRN Lloyd Huger, MD        OBJECTIVE: Vitals:   02/06/18 1454 02/06/18 1457  BP:  (!) 158/95  Pulse:  98  Resp: 16   Temp:  98 F (36.7 C)     Body mass index is 34.21 kg/m.    ECOG FS:0 -  Asymptomatic  General: Well-developed, well-nourished, no acute distress. Eyes: Pink conjunctiva, anicteric sclera. HEENT: Normocephalic, moist mucous membranes, clear oropharnyx.  No palpable lymphadenopathy.  Lungs: Clear to auscultation bilaterally. Heart: Regular rate and rhythm. No rubs, murmurs, or gallops. Abdomen: Soft, nontender, nondistended. No organomegaly noted, normoactive bowel sounds. Musculoskeletal: No edema, cyanosis, or clubbing. Neuro: Alert, answering all questions appropriately. Cranial nerves grossly intact. Skin: No rashes or petechiae noted. Psych: Normal affect.  LAB RESULTS:  Lab Results  Component Value Date   NA 139 01/30/2018   K 3.8 01/30/2018   CL 106 01/30/2018   CO2 26 01/30/2018   GLUCOSE 88 01/30/2018   BUN 17 01/30/2018   CREATININE 0.82 01/30/2018   CALCIUM 9.4 01/30/2018   PROT 8.1 01/30/2018   ALBUMIN 4.8 01/30/2018   AST 31 01/30/2018   ALT 56 (H) 01/30/2018   ALKPHOS 95 01/30/2018   BILITOT 0.7 01/30/2018   GFRNONAA >60 01/30/2018   GFRAA >60 01/30/2018    Lab Results  Component Value Date   WBC 6.1 01/30/2018   NEUTROABS 3.4 01/30/2018   HGB 14.9 01/30/2018   HCT 44.0 01/30/2018   MCV 86.4 01/30/2018   PLT 215 01/30/2018     STUDIES: Ct Soft Tissue Neck W Contrast  Result Date: 01/30/2018 CLINICAL DATA:  Nodular sclerosing stage III Hodgkin lymphoma. Continued surveillance. EXAM: CT NECK WITH CONTRAST TECHNIQUE: Multidetector CT imaging of the neck was performed using the standard protocol following the bolus administration of intravenous contrast. CONTRAST:  23m ISOVUE-300 IOPAMIDOL (ISOVUE-300) INJECTION 61% COMPARISON:  Multiple priors, most recent 07/29/2017. FINDINGS: Pharynx and larynx: Normal. No mass or swelling. Salivary glands: No inflammation, mass, or stone. Thyroid: Normal. Lymph nodes: Improvement or stability in all visible enlarged lymph nodes. Index RIGHT level IIb node 2:55 measures 14 mm compared with 16  mm prior. Other similar improvements in RIGHT level III node (8 mm today versus 11 mm prior) and level IV (12 mm today versus 15 mm prior) nodal stations Vascular: Negative.  Stable LEFT Port-A-Cath. Limited intracranial: Negative. Visualized orbits: Negative. Mastoids and visualized paranasal sinuses: Clear. Skeleton: No acute or aggressive process. Upper chest: Clear. Other: None IMPRESSION: Improvement or stability in all areas of nodal enlargement consistent with response to treatment. See discussion above. Electronically Signed   By: JStaci RighterM.D.   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  He completed 6 cycles of ABVD on October 26, 2016.  PET scan on January 24, 2017 revealed a complete metabolic response.  CT scan of the neck on January 30, 2018 revealed no recurrent or progressive disease.  No intervention is needed at this time.  Secondary to cost, patient has requested that we only monitor  CT of the neck since this was his primary site of disease.  Return to clinic in 6 months with repeat laboratory work, imaging, and further evaluation.  Once patient is 2 years removed from completing his treatments, can transition to yearly imaging. 2.  Hypertension: Patient's blood pressure is moderately elevated today.  Continue monitoring and treatment per PCP.   3.  Port: Patient is now over 1 year from completing his treatments.  Have referred to vascular surgery for port removal.  I spent a total of 20 minutes face-to-face with the patient of which greater than 50% of the visit was spent in counseling and coordination of care as detailed above.   Patient expressed understanding and was in agreement with this plan. He also understands that He can call clinic at any time with  any questions, concerns, or complaints.   Cancer Staging Nodular sclerosis Hodgkin lymphoma of lymph nodes of neck (HCC) Staging form: Hodgkin and Non-Hodgkin Lymphoma, AJCC 8th Edition - Clinical stage from 05/09/2016: Stage III (Hodgkin lymphoma, A - Asymptomatic) - Signed by Lloyd Huger, MD on 05/09/2016   Lloyd Huger, MD   02/07/2018 1:53 PM

## 2018-02-06 ENCOUNTER — Inpatient Hospital Stay (HOSPITAL_BASED_OUTPATIENT_CLINIC_OR_DEPARTMENT_OTHER): Payer: BLUE CROSS/BLUE SHIELD | Admitting: Oncology

## 2018-02-06 ENCOUNTER — Other Ambulatory Visit: Payer: Self-pay

## 2018-02-06 ENCOUNTER — Encounter: Payer: Self-pay | Admitting: Oncology

## 2018-02-06 VITALS — BP 158/95 | HR 98 | Temp 98.0°F | Resp 16 | Ht 68.0 in | Wt 225.0 lb

## 2018-02-06 DIAGNOSIS — Z95828 Presence of other vascular implants and grafts: Secondary | ICD-10-CM

## 2018-02-06 DIAGNOSIS — F419 Anxiety disorder, unspecified: Secondary | ICD-10-CM

## 2018-02-06 DIAGNOSIS — Z87891 Personal history of nicotine dependence: Secondary | ICD-10-CM | POA: Diagnosis not present

## 2018-02-06 DIAGNOSIS — C8111 Nodular sclerosis classical Hodgkin lymphoma, lymph nodes of head, face, and neck: Secondary | ICD-10-CM

## 2018-02-06 DIAGNOSIS — I10 Essential (primary) hypertension: Secondary | ICD-10-CM | POA: Diagnosis not present

## 2018-02-06 DIAGNOSIS — Z800 Family history of malignant neoplasm of digestive organs: Secondary | ICD-10-CM

## 2018-02-06 DIAGNOSIS — Z79899 Other long term (current) drug therapy: Secondary | ICD-10-CM

## 2018-02-06 NOTE — Progress Notes (Signed)
Patient here for follow up no changes since last appointment 

## 2018-02-11 ENCOUNTER — Encounter (INDEPENDENT_AMBULATORY_CARE_PROVIDER_SITE_OTHER): Payer: Self-pay

## 2018-02-24 ENCOUNTER — Other Ambulatory Visit (INDEPENDENT_AMBULATORY_CARE_PROVIDER_SITE_OTHER): Payer: Self-pay | Admitting: Nurse Practitioner

## 2018-02-25 ENCOUNTER — Other Ambulatory Visit: Payer: Self-pay

## 2018-02-25 ENCOUNTER — Ambulatory Visit
Admission: RE | Admit: 2018-02-25 | Discharge: 2018-02-25 | Disposition: A | Discharge status: Home / Self Care | Payer: BLUE CROSS/BLUE SHIELD | Source: Skilled Nursing Facility | Attending: Vascular Surgery | Admitting: Vascular Surgery

## 2018-02-25 ENCOUNTER — Encounter
Admission: RE | Disposition: A | Discharge status: Home / Self Care | Payer: Self-pay | Source: Skilled Nursing Facility | Attending: Vascular Surgery

## 2018-02-25 DIAGNOSIS — C8590 Non-Hodgkin lymphoma, unspecified, unspecified site: Secondary | ICD-10-CM | POA: Diagnosis not present

## 2018-02-25 DIAGNOSIS — I10 Essential (primary) hypertension: Secondary | ICD-10-CM | POA: Diagnosis not present

## 2018-02-25 DIAGNOSIS — Z87891 Personal history of nicotine dependence: Secondary | ICD-10-CM | POA: Diagnosis not present

## 2018-02-25 DIAGNOSIS — Z808 Family history of malignant neoplasm of other organs or systems: Secondary | ICD-10-CM | POA: Insufficient documentation

## 2018-02-25 DIAGNOSIS — Z452 Encounter for adjustment and management of vascular access device: Secondary | ICD-10-CM | POA: Insufficient documentation

## 2018-02-25 DIAGNOSIS — Z9889 Other specified postprocedural states: Secondary | ICD-10-CM | POA: Insufficient documentation

## 2018-02-25 DIAGNOSIS — C8190 Hodgkin lymphoma, unspecified, unspecified site: Secondary | ICD-10-CM

## 2018-02-25 DIAGNOSIS — Z8249 Family history of ischemic heart disease and other diseases of the circulatory system: Secondary | ICD-10-CM | POA: Insufficient documentation

## 2018-02-25 DIAGNOSIS — C8111 Nodular sclerosis classical Hodgkin lymphoma, lymph nodes of head, face, and neck: Secondary | ICD-10-CM | POA: Diagnosis not present

## 2018-02-25 HISTORY — PX: PORTA CATH REMOVAL: CATH118286

## 2018-02-25 SURGERY — PORTA CATH REMOVAL
Anesthesia: Moderate Sedation

## 2018-02-25 MED ORDER — SODIUM CHLORIDE 0.9 % IV SOLN
INTRAVENOUS | Status: DC
  Administered 2018-02-25: 08:00:00 via INTRAVENOUS

## 2018-02-25 MED ORDER — CEFAZOLIN SODIUM-DEXTROSE 1-4 GM/50ML-% IV SOLN
INTRAVENOUS | Status: AC
  Administered 2018-02-25: 1 g via INTRAVENOUS
  Filled 2018-02-25: qty 50

## 2018-02-25 MED ORDER — MIDAZOLAM HCL 2 MG/2ML IJ SOLN
INTRAMUSCULAR | Status: AC
  Filled 2018-02-25: qty 4

## 2018-02-25 MED ORDER — HEPARIN (PORCINE) IN NACL 1000-0.9 UT/500ML-% IV SOLN
INTRAVENOUS | Status: AC
  Filled 2018-02-25: qty 500

## 2018-02-25 MED ORDER — ONDANSETRON HCL 4 MG/2ML IJ SOLN: 4.0000 mg | Freq: Four times a day (QID) | INTRAMUSCULAR | Status: DC | PRN

## 2018-02-25 MED ORDER — LIDOCAINE-EPINEPHRINE (PF) 1 %-1:200000 IJ SOLN
INTRAMUSCULAR | Status: AC
  Filled 2018-02-25: qty 10

## 2018-02-25 MED ORDER — MIDAZOLAM HCL 2 MG/2ML IJ SOLN
INTRAMUSCULAR | Status: AC
  Filled 2018-02-25: qty 2

## 2018-02-25 MED ORDER — CEFAZOLIN SODIUM-DEXTROSE 1-4 GM/50ML-% IV SOLN
INTRAVENOUS | Status: AC
  Filled 2018-02-25: qty 50

## 2018-02-25 MED ORDER — FENTANYL CITRATE (PF) 100 MCG/2ML IJ SOLN
INTRAMUSCULAR | Status: DC | PRN
  Administered 2018-02-25 (×2): 50 ug via INTRAVENOUS
  Administered 2018-02-25: 25 ug via INTRAVENOUS

## 2018-02-25 MED ORDER — FENTANYL CITRATE (PF) 100 MCG/2ML IJ SOLN
INTRAMUSCULAR | Status: AC
  Filled 2018-02-25: qty 2

## 2018-02-25 MED ORDER — HYDROMORPHONE HCL 1 MG/ML IJ SOLN: 1.0000 mg | Freq: Once | INTRAMUSCULAR | Status: DC | PRN

## 2018-02-25 MED ORDER — MIDAZOLAM HCL 2 MG/2ML IJ SOLN
INTRAMUSCULAR | Status: DC | PRN
  Administered 2018-02-25 (×2): 1 mg via INTRAVENOUS
  Administered 2018-02-25: 2 mg via INTRAVENOUS
  Administered 2018-02-25: 0.5 mg via INTRAVENOUS

## 2018-02-25 MED ORDER — CEFAZOLIN SODIUM-DEXTROSE 1-4 GM/50ML-% IV SOLN
1.0000 g | Freq: Once | INTRAVENOUS | Status: AC
  Administered 2018-02-25: 1 g via INTRAVENOUS

## 2018-02-25 SURGICAL SUPPLY — 6 items
DERMABOND ADVANCED (GAUZE/BANDAGES/DRESSINGS) ×2
DERMABOND ADVANCED .7 DNX12 (GAUZE/BANDAGES/DRESSINGS) ×1 IMPLANT
PACK ANGIOGRAPHY (CUSTOM PROCEDURE TRAY) ×3 IMPLANT
SUT MNCRL AB 4-0 PS2 18 (SUTURE) ×3 IMPLANT
SUT VIC AB 3-0 SH 27 (SUTURE) ×2
SUT VIC AB 3-0 SH 27X BRD (SUTURE) ×1 IMPLANT

## 2018-02-25 NOTE — Op Note (Signed)
  OPERATIVE NOTE   PROCEDURE: Removal of Infuse-a-Port  PRE-OPERATIVE DIAGNOSIS: Lymphoma  POST-OPERATIVE DIAGNOSIS: Same  SURGEON: Hortencia Pilar, M.D.  ANESTHESIA: Conscious sedation was administered under my direct supervision by the interventional radiology RN. IV Versed plus fentanyl were utilized. Continuous ECG, pulse oximetry and blood pressure was monitored throughout the entire procedure.  Conscious sedation was for a total of 26 minutes.   ESTIMATED BLOOD LOSS: Minimal   SPECIMEN(S):  Infuse-a-port intact  INDICATIONS:   Wyatt Bates is a 42 y.o. y.o. male who presents with remission of his lymphoma status post chemotherapy.  The chemotherapy has been completed and the port is no longer required. Patient is therefore undergoing removal of the port. The risks and benefits of been reviewed all questions answered patient agrees to proceed with port removal   DESCRIPTION: After obtaining full informed written consent, the patient is brought to special procedures and positioned supine.  The patient received IV antibiotics.  The patient was prepped and draped in the standard fashion appropriate time out is called.    After infiltrating 1% lidocaine with epinephrine into the soft tissues and skin surrounding the port the previous incisional scar is reopened with an 11 blade scalpel.  The port is slipped from the pocket and subsequently removed without difficulty otherwise intact.  Pressure is held at the base of the neck for 5 minutes, a pocket is irrigated. Subtotally the wound is packed with saline moistened gauze and sterile dressings applied.  The patient tolerated the procedure without changes  COMPLICATIONS: None  CONDITION: Good  Hortencia Pilar, M.D. [facility] Office: [phone]   02/25/2018, 10:30 AM

## 2018-02-25 NOTE — Discharge Instructions (Signed)
Moderate Conscious Sedation, Adult, Care After °These instructions provide you with information about caring for yourself after your procedure. Your health care provider may also give you more specific instructions. Your treatment has been planned according to current medical practices, but problems sometimes occur. Call your health care provider if you have any problems or questions after your procedure. °What can I expect after the procedure? °After your procedure, it is common: °· To feel sleepy for several hours. °· To feel clumsy and have poor balance for several hours. °· To have poor judgment for several hours. °· To vomit if you eat too soon. ° °Follow these instructions at home: °For at least 24 hours after the procedure: ° °· Do not: °? Participate in activities where you could fall or become injured. °? Drive. °? Use heavy machinery. °? Drink alcohol. °? Take sleeping pills or medicines that cause drowsiness. °? Make important decisions or sign legal documents. °? Take care of children on your own. °· Rest. °Eating and drinking °· Follow the diet recommended by your health care provider. °· If you vomit: °? Drink water, juice, or soup when you can drink without vomiting. °? Make sure you have little or no nausea before eating solid foods. °General instructions °· Have a responsible adult stay with you until you are awake and alert. °· Take over-the-counter and prescription medicines only as told by your health care provider. °· If you smoke, do not smoke without supervision. °· Keep all follow-up visits as told by your health care provider. This is important. °Contact a health care provider if: °· You keep feeling nauseous or you keep vomiting. °· You feel light-headed. °· You develop a rash. °· You have a fever. °Get help right away if: °· You have trouble breathing. °This information is not intended to replace advice given to you by your health care provider. Make sure you discuss any questions you have  with your health care provider. °Document Released: 12/31/2012 Document Revised: 08/15/2015 Document Reviewed: 07/02/2015 °Elsevier Interactive Patient Education © 2018 [employer] ° ° ° ° °Implanted Port Removal, Care After °Refer to this sheet in the next few weeks. These instructions provide you with information about caring for yourself after your procedure. Your health care provider may also give you more specific instructions. Your treatment has been planned according to current medical practices, but problems sometimes occur. Call your health care provider if you have any problems or questions after your procedure. °What can I expect after the procedure? °After the procedure, it is common to have: °· Soreness or pain near your incision. °· Some swelling or bruising near your incision. ° °Follow these instructions at home: °Medicines °· Take over-the-counter and prescription medicines only as told by your health care provider. °· If you were prescribed an antibiotic medicine, take it as told by your health care provider. Do not stop taking the antibiotic even if you start to feel better. °Bathing °· Do not take baths, swim, or use a hot tub until your health care provider approves. Ask your health care provider if you can take showers. You may only be allowed to take sponge baths for bathing. °Incision care °· Follow instructions from your health care provider about how to take care of your incision. Make sure you: °? Wash your hands with soap and water before you change your bandage (dressing). If soap and water are not available, use hand sanitizer. °? Change your dressing as told by your health care provider. °?   Keep your dressing dry. °? Leave stitches (sutures), skin glue, or adhesive strips in place. These skin closures may need to stay in place for 2 weeks or longer. If adhesive strip edges start to loosen and curl up, you may trim the loose edges. Do not remove adhesive strips completely unless your  health care provider tells you to do that. °· Check your incision area every day for signs of infection. Check for: °? More redness, swelling, or pain. °? More fluid or blood. °? Warmth. °? Pus or a bad smell. °Driving °· If you received a sedative, do not drive for 24 hours after the procedure. °· If you did not receive a sedative, ask your health care provider when it is safe to drive. °Activity °· Return to your normal activities as told by your health care provider. Ask your health care provider what activities are safe for you. °· Until your health care provider says it is safe: °? Do not lift anything that is heavier than 10 lb (4.5 kg). °? Do not do activities that involve lifting your arms over your head. °General instructions °· Do not use any tobacco products, such as cigarettes, chewing tobacco, and e-cigarettes. Tobacco can delay healing. If you need help quitting, ask your health care provider. °· Keep all follow-up visits as told by your health care provider. This is important. °Contact a health care provider if: °· You have more redness, swelling, or pain around your incision. °· You have more fluid or blood coming from your incision. °· Your incision feels warm to the touch. °· You have pus or a bad smell coming from your incision. °· You have a fever. °· You have pain that is not relieved by your pain medicine. °Get help right away if: °· You have chest pain. °· You have difficulty breathing. °This information is not intended to replace advice given to you by your health care provider. Make sure you discuss any questions you have with your health care provider. °Document Released: 02/21/2015 Document Revised: 08/18/2015 Document Reviewed: 12/15/2014 °Elsevier Interactive Patient Education © 2018 [employer] ° °

## 2018-02-25 NOTE — H&P (Signed)
[facility] History & Physical Update  The patient was interviewed and re-examined.  The patient's previous History and Physical has been reviewed and is unchanged.  There is no change in the plan of care. We plan to proceed with the scheduled procedure.  Hortencia Pilar, MD  02/25/2018, 10:26 AM

## 2018-03-06 ENCOUNTER — Encounter: Payer: Self-pay | Admitting: *Deleted

## 2018-03-06 ENCOUNTER — Ambulatory Visit: Payer: BLUE CROSS/BLUE SHIELD | Admitting: Primary Care

## 2018-03-06 ENCOUNTER — Encounter: Payer: Self-pay | Admitting: Primary Care

## 2018-03-06 VITALS — BP 140/80 | HR 106 | Temp 98.0°F | Ht 68.0 in | Wt 227.5 lb

## 2018-03-06 DIAGNOSIS — J069 Acute upper respiratory infection, unspecified: Secondary | ICD-10-CM | POA: Diagnosis not present

## 2018-03-06 MED ORDER — AZITHROMYCIN 250 MG PO TABS: ORAL_TABLET | ORAL | 0 refills | Status: DC

## 2018-03-06 MED ORDER — BENZONATATE 200 MG PO CAPS: 200.0000 mg | ORAL_CAPSULE | Freq: Three times a day (TID) | ORAL | 0 refills | Status: DC | PRN

## 2018-03-06 NOTE — Patient Instructions (Signed)
Start Azithromycin antibiotics for infection. Take 2 tablets by mouth today, then 1 tablet daily for 4 additional days.  You may take Benzonatate capsules for cough. Take 1 capsule by mouth three times daily as needed for cough.  Make sure to drink plenty of fluids and rest.  Please schedule a physical with me in 3 months. You may also schedule a lab only appointment 3-4 days prior. We will discuss your lab results in detail during your physical.  It was a pleasure to see you today!

## 2018-03-06 NOTE — Progress Notes (Signed)
Subjective:    Patient ID: Wyatt Bates, male    DOB: Aug 29, 1975, 42 y.o.   MRN: [id number]  HPI  Wyatt Bates is a 42 year old male with a history of hodgkin lymphoma in remission who presents today with a chef complaint of cough.   He also reports sore throat, chest congestion. He had his port removed 02/25/18, symptoms began several days prior to this, and have been around for 11 days now. His cough is productive with brown sputum. He denies fevers. He's been taking Mucinex, Nyquil, and Delsym without improvement. Overall his cough is worse.   Review of Systems  Constitutional: Negative for fever.  HENT: Positive for congestion, postnasal drip and sore throat.   Respiratory: Positive for cough. Negative for shortness of breath and wheezing.        Past Medical History:  Diagnosis Date  . Medical history non-contributory   . 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     Social History   Socioeconomic History  . Marital status: Married    Spouse name: Not on file  . Number of children: Not on file  . Years of education: Not on file  . Highest education level: Not on file  Occupational History  . Not on file  Social Needs  . Financial resource strain: Not on file  . Food insecurity:    Worry: Not on file    Inability: Not on file  . Transportation needs:    Medical: Not on file    Non-medical: Not on file  Tobacco Use  . Smoking status: Former Research scientist (life sciences)  . Smokeless tobacco: Former Systems developer    Types: Snuff    Quit date: 05/25/2015  . Tobacco comment: social in college  Substance and Sexual Activity  . Alcohol use: Yes    Alcohol/week: 2.0 standard drinks    Types: 2 Glasses of wine per week  . Drug use: No  . Sexual activity: Not on file  Lifestyle  . Physical activity:    Days per week: Not on file    Minutes per session: Not on file  . Stress: Not on file  Relationships  . Social connections:    Talks on phone: Not on  file    Gets together: Not on file    Attends religious service: Not on file    Active member of club or organization: Not on file    Attends meetings of clubs or organizations: Not on file    Relationship status: Not on file  . Intimate partner violence:    Fear of current or ex partner: Not on file    Emotionally abused: Not on file    Physically abused: Not on file    Forced sexual activity: Not on file  Other Topics Concern  . Not on file  Social History Narrative   Married.   2 children.   Works in [employer].   Enjoys golf, baseball, hunting, spending time with family.    Past Surgical History:  Procedure Laterality Date  . LYMPH NODE BIOPSY Right 04/13/2016   Procedure: biopsy of lymph nodes open deep cervical node (right);  Surgeon: Margaretha Sheffield, MD;  Location: [place];  Service: ENT;  Laterality: Right;  . NO PAST SURGERIES    . PORTA CATH INSERTION N/A 05/15/2016   Procedure: Glori Luis Cath Insertion;  Surgeon: Katha Cabal, MD;  Location: [facility];  Service: Cardiovascular;  Laterality: N/A;  . PORTA CATH REMOVAL N/A 02/25/2018   Procedure: PORTA CATH REMOVAL;  Surgeon: Katha Cabal, MD;  Location: [place];  Service: Cardiovascular;  Laterality: N/A;    Family History  Problem Relation Age of Onset  . Heart disease Father   . COPD Father   . Hypertension Father   . Hypertension Brother   . Alzheimer's disease Maternal Grandmother   . Pancreatic cancer Maternal Grandfather     No Known Allergies  Current Outpatient Medications on File Prior to Visit  Medication Sig Dispense Refill  . ibuprofen (ADVIL,MOTRIN) 200 MG tablet Take 400-600 mg by mouth every 8 (eight) hours as needed (for headaches.).      Current Facility-Administered Medications on File Prior to Visit  Medication Dose Route Frequency Provider Last Rate Last Dose  . ceFAZolin (ANCEF) IVPB 1 g/50 mL premix  1 g Intravenous Once Schnier, Dolores Lory, MD      .  heparin lock flush 100 unit/mL  500 Units Intravenous Once Lloyd Huger, MD      . sodium chloride flush (NS) 0.9 % injection 10 mL  10 mL Intravenous PRN Finnegan, Kathlene November, MD        BP 140/80   Pulse (!) 106   Temp 98 F (36.7 C) (Oral)   Ht 5\' 8"  (1.727 m)   Wt 227 lb 8 oz (103.2 kg)   SpO2 97%   BMI 34.59 kg/m    Objective:   Physical Exam  Constitutional: He appears well-nourished. He does not appear ill.  HENT:  Right Ear: Tympanic membrane and ear canal normal.  Left Ear: Tympanic membrane and ear canal normal.  Nose: No mucosal edema. Right sinus exhibits no maxillary sinus tenderness and no frontal sinus tenderness. Left sinus exhibits no maxillary sinus tenderness and no frontal sinus tenderness.  Mouth/Throat: Oropharynx is clear and moist.  Neck: Neck supple.  Cardiovascular: Normal rate and regular rhythm.  Respiratory: Effort normal. He has no decreased breath sounds. He has wheezes in the right upper field. He has rhonchi in the right upper field, the right lower field and the left upper field.  Skin: Skin is warm and dry.           Assessment & Plan:  URI:  Cough, congestion x 11 days. Cough worse. Exam today suspicious. Rhonchi to right base, and upper fields. Given duration of symptoms, presentation, and history, will treat. Rx for azithromycin and benzonatate capsules sent to pharmacy.  Fluids, rest, follow up PRN.  [name], NP

## 2018-03-10 ENCOUNTER — Ambulatory Visit (INDEPENDENT_AMBULATORY_CARE_PROVIDER_SITE_OTHER): Payer: BLUE CROSS/BLUE SHIELD | Admitting: Vascular Surgery

## 2018-03-10 ENCOUNTER — Encounter (INDEPENDENT_AMBULATORY_CARE_PROVIDER_SITE_OTHER): Payer: Self-pay | Admitting: Vascular Surgery

## 2018-03-10 VITALS — BP 142/96 | HR 89 | Resp 14 | Ht 68.0 in | Wt 225.0 lb

## 2018-03-10 DIAGNOSIS — C8111 Nodular sclerosis classical Hodgkin lymphoma, lymph nodes of head, face, and neck: Secondary | ICD-10-CM

## 2018-03-10 NOTE — Progress Notes (Signed)
MRN : [id number]  Wyatt Bates is a 42 y.o. ([birth year]) male who presents with chief complaint of No chief complaint on file. [name]  History of Present Illness:  He is s/p Removal of Infuse-a-Port on 02/25/2018.  He has no c/o  He played golf this weekend    No outpatient medications have been marked as taking for the 03/10/18 encounter (Appointment) with Delana Meyer, Dolores Lory, MD.   Current Facility-Administered Medications for the 03/10/18 encounter (Appointment) with Delana Meyer, Dolores Lory, MD  Medication  . ceFAZolin (ANCEF) IVPB 1 g/50 mL premix    Past Medical History:  Diagnosis Date  . Medical history non-contributory   . 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    Past Surgical History:  Procedure Laterality Date  . LYMPH NODE BIOPSY Right 04/13/2016   Procedure: biopsy of lymph nodes open deep cervical node (right);  Surgeon: Margaretha Sheffield, MD;  Location: [place];  Service: ENT;  Laterality: Right;  . NO PAST SURGERIES    . PORTA CATH INSERTION N/A 05/15/2016   Procedure: Glori Luis Cath Insertion;  Surgeon: Katha Cabal, MD;  Location: [facility];  Service: Cardiovascular;  Laterality: N/A;  . PORTA CATH REMOVAL N/A 02/25/2018   Procedure: PORTA CATH REMOVAL;  Surgeon: Katha Cabal, MD;  Location: [facility];  Service: Cardiovascular;  Laterality: N/A;    Social History Social History   Tobacco Use  . Smoking status: Former Research scientist (life sciences)  . Smokeless tobacco: Former Systems developer    Types: Snuff    Quit date: 05/25/2015  . Tobacco comment: social in college  Substance Use Topics  . Alcohol use: Yes    Alcohol/week: 2.0 standard drinks    Types: 2 Glasses of wine per week  . Drug use: No    Family History Family History  Problem Relation Age of Onset  . Heart disease Father   . COPD Father   . Hypertension Father   . Hypertension Brother   . Alzheimer's disease Maternal Grandmother   .  Pancreatic cancer Maternal Grandfather     No Known Allergies   REVIEW OF SYSTEMS (Negative unless checked)  Constitutional: [] Weight loss  [] Fever  [] Chills Cardiac: [] Chest pain   [] Chest pressure   [] Palpitations   [] Shortness of breath when laying flat   [] Shortness of breath with exertion. Vascular:  [] Pain in legs with walking   [] Pain in legs at rest  [] History of DVT   [] Phlebitis   [] Swelling in legs   [] Varicose veins   [] Non-healing ulcers Pulmonary:   [] Uses home oxygen   [] Productive cough   [] Hemoptysis   [] Wheeze  [] COPD   [] Asthma Neurologic:  [] Dizziness   [] Seizures   [] History of stroke   [] History of TIA  [] Aphasia   [] Vissual changes   [] Weakness or numbness in arm   [] Weakness or numbness in leg Musculoskeletal:   [] Joint swelling   [] Joint pain   [] Low back pain Hematologic:  [] Easy bruising  [] Easy bleeding   [] Hypercoagulable state   [] Anemic Gastrointestinal:  [] Diarrhea   [] Vomiting  [] Gastroesophageal reflux/heartburn   [] Difficulty swallowing. Genitourinary:  [] Chronic kidney disease   [] Difficult urination  [] Frequent urination   [] Blood in urine Skin:  [] Rashes   [] Ulcers  Psychological:  [] History of anxiety   []  History of major depression.  Physical Examination  There were no vitals filed for this visit. There is no height or weight on  file to calculate BMI. Gen: WD/WN, NAD Head: [place]/AT, No temporalis wasting.  Ear/Nose/Throat: Hearing grossly intact, nares w/o erythema or drainage Eyes: PER, EOMI, sclera nonicteric.  Neck: Supple, no large masses.   Pulmonary:  Good air movement, no audible wheezing bilaterally, no use of accessory muscles.  Cardiac: RRR, no JVD Vascular:  Left chest port site CD&I Gastrointestinal: Non-distended. No guarding/no peritoneal signs.  Musculoskeletal: M/S 5/5 throughout.  No deformity or atrophy.  Neurologic: CN 2-12 intact. Symmetrical.  Speech is fluent. Motor exam as listed above. Psychiatric: Judgment intact, Mood  & affect appropriate for pt's clinical situation. Dermatologic: No rashes or ulcers noted.  No changes consistent with cellulitis. Lymph : No lichenification or skin changes of chronic lymphedema.  CBC Lab Results  Component Value Date   WBC 6.1 01/30/2018   HGB 14.9 01/30/2018   HCT 44.0 01/30/2018   MCV 86.4 01/30/2018   PLT 215 01/30/2018    BMET    Component Value Date/Time   NA 139 01/30/2018 1401   K 3.8 01/30/2018 1401   CL 106 01/30/2018 1401   CO2 26 01/30/2018 1401   GLUCOSE 88 01/30/2018 1401   BUN 17 01/30/2018 1401   CREATININE 0.82 01/30/2018 1401   CALCIUM 9.4 01/30/2018 1401   GFRNONAA >60 01/30/2018 1401   GFRAA >60 01/30/2018 1401   CrCl cannot be calculated (Patient's most recent lab result is older than the maximum 21 days allowed.).  COAG Lab Results  Component Value Date   INR 1.05 05/03/2016    Radiology No results found.   Assessment/Plan 1. 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

## 2018-06-10 ENCOUNTER — Other Ambulatory Visit: Payer: Self-pay | Admitting: Primary Care

## 2018-06-10 DIAGNOSIS — Z0000 Encounter for general adult medical examination without abnormal findings: Secondary | ICD-10-CM

## 2018-06-17 ENCOUNTER — Other Ambulatory Visit: Payer: BLUE CROSS/BLUE SHIELD

## 2018-06-19 ENCOUNTER — Encounter: Payer: BLUE CROSS/BLUE SHIELD | Admitting: Primary Care

## 2018-08-11 ENCOUNTER — Other Ambulatory Visit: Payer: Self-pay | Admitting: *Deleted

## 2018-08-11 ENCOUNTER — Ambulatory Visit
Admission: RE | Admit: 2018-08-11 | Discharge: 2018-08-11 | Disposition: A | Discharge status: Home / Self Care | Payer: BLUE CROSS/BLUE SHIELD | Source: Ambulatory Visit | Attending: Oncology | Admitting: Oncology

## 2018-08-11 ENCOUNTER — Other Ambulatory Visit: Payer: Self-pay

## 2018-08-11 ENCOUNTER — Inpatient Hospital Stay: Payer: BLUE CROSS/BLUE SHIELD | Attending: Oncology

## 2018-08-11 DIAGNOSIS — C8111 Nodular sclerosis classical Hodgkin lymphoma, lymph nodes of head, face, and neck: Secondary | ICD-10-CM | POA: Insufficient documentation

## 2018-08-11 LAB — COMPREHENSIVE METABOLIC PANEL
ALT: 43 U/L (ref 0–44)
AST: 25 U/L (ref 15–41)
Albumin: 4.6 g/dL (ref 3.5–5.0)
Alkaline Phosphatase: 95 U/L (ref 38–126)
Anion gap: 8 (ref 5–15)
BUN: 21 mg/dL — ABNORMAL HIGH (ref 6–20)
CO2: 24 mmol/L (ref 22–32)
Calcium: 8.7 mg/dL — ABNORMAL LOW (ref 8.9–10.3)
Chloride: 106 mmol/L (ref 98–111)
Creatinine, Ser: 0.84 mg/dL (ref 0.61–1.24)
GFR calc Af Amer: >60 mL/min (ref >60)
GFR calc non Af Amer: >60 mL/min (ref >60)
Glucose, Bld: 91 mg/dL (ref 70–99)
Potassium: 3.7 mmol/L (ref 3.5–5.1)
Sodium: 138 mmol/L (ref 135–145)
Total Bilirubin: 0.7 mg/dL (ref 0.3–1.2)
Total Protein: 7.8 g/dL (ref 6.5–8.1)

## 2018-08-11 LAB — CBC WITH DIFFERENTIAL/PLATELET
Abs Immature Granulocytes: 0.02 10*3/uL (ref 0.00–0.07)
Basophils Absolute: 0.1 10*3/uL (ref 0.0–0.1)
Basophils Relative: 1 %
Eosinophils Absolute: 0.3 10*3/uL (ref 0.0–0.5)
Eosinophils Relative: 5 %
HCT: 41.7 % (ref 39.0–52.0)
Hemoglobin: 14.2 g/dL (ref 13.0–17.0)
Immature Granulocytes: 0 %
Lymphocytes Relative: 30 %
Lymphs Abs: 1.9 10*3/uL (ref 0.7–4.0)
MCH: 30.1 pg (ref 26.0–34.0)
MCHC: 34.1 g/dL (ref 30.0–36.0)
MCV: 88.3 fL (ref 80.0–100.0)
Monocytes Absolute: 0.6 10*3/uL (ref 0.1–1.0)
Monocytes Relative: 10 %
Neutro Abs: 3.5 10*3/uL (ref 1.7–7.7)
Neutrophils Relative %: 54 %
Platelets: 210 10*3/uL (ref 150–400)
RBC: 4.72 MIL/uL (ref 4.22–5.81)
RDW: 12.5 % (ref 11.5–15.5)
WBC: 6.4 10*3/uL (ref 4.0–10.5)
nRBC: 0 % (ref 0.0–0.2)

## 2018-08-11 MED ORDER — IOHEXOL 300 MG/ML  SOLN
75.0000 mL | Freq: Once | INTRAMUSCULAR | Status: AC | PRN
  Administered 2018-08-11: 15:00:00 75 mL via INTRAVENOUS

## 2018-08-13 ENCOUNTER — Other Ambulatory Visit: Payer: Self-pay

## 2018-08-14 ENCOUNTER — Inpatient Hospital Stay (HOSPITAL_BASED_OUTPATIENT_CLINIC_OR_DEPARTMENT_OTHER): Payer: BLUE CROSS/BLUE SHIELD | Admitting: Oncology

## 2018-08-14 ENCOUNTER — Other Ambulatory Visit: Payer: Self-pay

## 2018-08-14 ENCOUNTER — Encounter: Payer: Self-pay | Admitting: Oncology

## 2018-08-14 DIAGNOSIS — C8111 Nodular sclerosis classical Hodgkin lymphoma, lymph nodes of head, face, and neck: Secondary | ICD-10-CM | POA: Diagnosis not present

## 2018-08-14 NOTE — Progress Notes (Signed)
[place]  Telephone:(336) [phone] Fax:(336) [phone]  ID: Wyatt Bates OB: 12/02/1975  MR#: [id number]  KPT#:[id number]  Patient Care Team: [name], NP as PCP - General (Internal Medicine)  I connected with Wyatt Bates on 08/17/18 at  2:30 PM EDT by video enabled telemedicine visit and verified that I am speaking with the correct person using two identifiers.   I discussed the limitations, risks, security and privacy concerns of performing an evaluation and management service by telemedicine and the availability of in-person appointments. I also discussed with the patient that there may be a patient responsible charge related to this service. The patient expressed understanding and agreed to proceed.   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  He currently feels well and is asymptomatic. He denies any fevers, night sweats, or weight loss. He has no neurologic complaints. He denies any dysphagia or difficulty swallowing.  He denies any chest pain, shortness of breath, cough, or hemoptysis.  He denies any nausea, vomiting, constipation, or diarrhea. He has no urinary complaints.  Patient feels at his baseline offers no specific complaints today.  REVIEW OF SYSTEMS:   Review of Systems  Constitutional: Negative.  Negative for fever, malaise/fatigue and weight loss.  HENT: Negative.   Respiratory: Negative.  Negative for cough and shortness of breath.   Cardiovascular: Negative.  Negative for chest pain and leg swelling.  Gastrointestinal: Negative.  Negative for abdominal pain, nausea and vomiting.  Genitourinary:  Negative.  Negative for dysuria.  Musculoskeletal: Negative.  Negative for neck pain.  Skin: Negative.  Negative for rash.  Neurological: Negative.  Negative for sensory change and weakness.  Psychiatric/Behavioral: Negative.  The patient is not nervous/anxious.     As per HPI. Otherwise, a complete review of systems is negative.  PAST MEDICAL HISTORY: Past Medical History:  Diagnosis Date  . Medical history non-contributory   . Nodular sclerosis Hod[place]kin [place] of [place] 03[place]   Chemo tx's with lymph node resection    PAST SURGICAL HISTORY: Past Surgical History:  Procedure Laterality Date  . LYMPH NODE BIOPSY Right 04/13/2016   Procedure: biopsy of lymph nodes open deep cervical node (right);  Surgeon: PMargaretha Sheffield MD;  [facility]  Service: ENT;  Laterality: Right;  . NO PAST SURGERIES    . PORTA CATH INSERTION N/A 05/15/2016   Procedure: PGlori LuisCath Insertion;  Surgeon: GKatha Cabal MD;  [facility];  Service: Cardiovascular;  Laterality: N/A;  . PORTA CATH REMOVAL N/A 02/25/2018   Procedure: PORTA CATH REMOVAL;  Surgeon: SKatha Cabal MD;  [facility];  Service: Cardiovascular;  Laterality: N/A;    FAMILY HISTORY: Family History  Problem Relation Age of Onset  . Heart disease Father   . COPD Father   . Hypertension Father   . Hypertension Brother   . Alzheimer's disease Maternal Grandmother   . Pancreatic cancer Maternal Grandfather     ADVANCED DIRECTIVES (Y/N):  N  HEALTH MAINTENANCE: Social History   Tobacco Use  . Smoking status: Never Smoker  . Smokeless tobacco: Former USystems developer   Types: Snuff  . Tobacco comment: social in  college  Substance Use Topics  . Alcohol use: Yes    Alcohol/week: 2.0 standard drinks    Types: 2 Glasses of wine per week  . Drug use: No     Colonoscopy:  PAP:  Bone density:  Lipid panel:  No Known Allergies  No current outpatient  medications on file.   Current Facility-Administered Medications  Medication Dose Route Frequency Provider Last Rate Last Dose  . ceFAZolin (ANCEF) IVPB 1 g/50 mL premix  1 g Intravenous Once Schnier, Dolores Lory, MD       Facility-Administered Medications Ordered in Other Visits  Medication Dose Route Frequency Provider Last Rate Last Dose  . heparin lock flush 100 unit/mL  500 Units Intravenous Once Lloyd Huger, MD      . sodium chloride flush (NS) 0.9 % injection 10 mL  10 mL Intravenous PRN Lloyd Huger, MD        OBJECTIVE: There were no vitals filed for this visit.   There is no height or weight on file to calculate BMI.    ECOG FS:0 - Asymptomatic  General: Well-developed, well-nourished, no acute distress. HEENT: Normocephalic. Neuro: Alert, answering all questions appropriately. Cranial nerves grossly intact. Skin: No rashes or petechiae noted. Psych: Normal affect. Lymphatics: No obvious cervical lymphadenopathy.  LAB RESULTS:  Lab Results  Component Value Date   NA 138 08/11/2018   K 3.7 08/11/2018   CL 106 08/11/2018   CO2 24 08/11/2018   GLUCOSE 91 08/11/2018   BUN 21 (H) 08/11/2018   CREATININE 0.84 08/11/2018   CALCIUM 8.7 (L) 08/11/2018   PROT 7.8 08/11/2018   ALBUMIN 4.6 08/11/2018   AST 25 08/11/2018   ALT 43 08/11/2018   ALKPHOS 95 08/11/2018   BILITOT 0.7 08/11/2018   GFRNONAA >60 08/11/2018   GFRAA >60 08/11/2018    Lab Results  Component Value Date   WBC 6.4 08/11/2018   NEUTROABS 3.5 08/11/2018   HGB 14.2 08/11/2018   HCT 41.7 08/11/2018   MCV 88.3 08/11/2018   PLT 210 08/11/2018     STUDIES: Ct Soft Tissue Neck W Contrast  Result Date: 08/12/2018 CLINICAL DATA:  Follow-up Hodgkin's lymphoma. EXAM: CT NECK WITH CONTRAST TECHNIQUE: Multidetector CT imaging of the neck was performed using the standard protocol following the bolus administration of intravenous contrast. CONTRAST:  52m OMNIPAQUE IOHEXOL 300 MG/ML  SOLN  COMPARISON:  None. FINDINGS: Pharynx and larynx: Normal. No mass or swelling. Salivary glands: No inflammation, mass, or stone. Thyroid: Normal. Lymph nodes: Continued enlargement of multiple lymph nodes, rightward predominant is essentially stable to slightly improved from November; index lesion RIGHT level IIB, unchanged measures 13 mm (2:49). The larger nodes in the RIGHT neck, for instance level III (8 mm) and IV (12 mm) appear unchanged in size, but the smaller lymph nodes appear to demonstrate continued involution. No concerning areas of nodal progression. Vascular: Negative. Limited intracranial: Negative. Visualized orbits: Negative. Mastoids and visualized paranasal sinuses: Clear. Skeleton: No acute or aggressive process. Upper chest: Negative. Other: None. IMPRESSION: Stable to slightly improved cervical adenopathy consistent with stage III nodular sclerosing Hodgkin's lymphoma of the neck. 13 mm index lesion on the RIGHT demonstrates interval stability. Continued surveillance is warranted. Electronically Signed   By: JStaci RighterM.D.   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  He completed 6 cycles of ABVD on October 26, 2016.  PET scan on January 24, 2017 revealed a complete metabolic response.  CT scan of the neck on Aug 12, 2018 reviewed independently and reported as above with no obvious evidence of recurrent or progressive disease.  No intervention is needed at this time.  Secondary to cost, patient has requested that we only monitor CT of the neck since this was his primary site of disease.  Return to clinic in 6 months with repeat laboratory work, imaging, and further evaluation.  At this point patient will be greater than 2 years removed from completing his treatments  and can be transitioned to yearly imaging and evaluation.   I provided 15 minutes of face-to-face video visit time during this encounter, and > 50% was spent counseling as documented under my assessment & plan.  Patient expressed understanding and was in agreement with this plan. He also understands that He can call clinic at any time with any questions, concerns, or complaints.   Cancer Staging Nodular sclerosis Hodgkin lymphoma of lymph nodes of neck (HCC) Staging form: Hodgkin and Non-Hodgkin Lymphoma, AJCC 8th Edition - Clinical stage from 05/09/2016: Stage III (Hodgkin lymphoma, A - Asymptomatic) - Signed by Lloyd Huger, MD on 05/09/2016   Lloyd Huger, MD   08/17/2018 9:23 AM

## 2018-08-14 NOTE — Progress Notes (Signed)
Patient stated that he had been doing well with no complaints. 

## 2018-10-07 IMAGING — CT CT CHEST W/ CM
2 of 3 series · 15 of 36 positions shown, 18 images · IV contrast (isovue)
Comparison: None.

CLINICAL DATA: Enlarging right supraclavicular neck mass.

EXAM:
CT CHEST WITH CONTRAST
TECHNIQUE: Multidetector CT imaging of the chest was performed during
intravenous contrast administration.
CONTRAST:  75 mL Isovue 300

[Series 4: soft tissue · axial · 0.75mm/px · z∈[-598,-344]mm · 12 of 151 slices shown, 15 images]
[im 12/151  mediastinal]
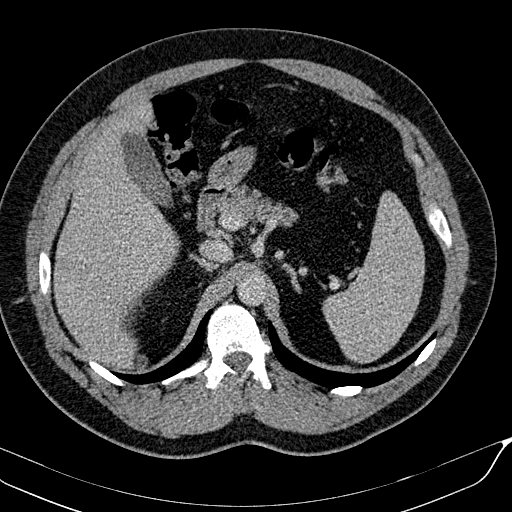
[im 12/151  lung]
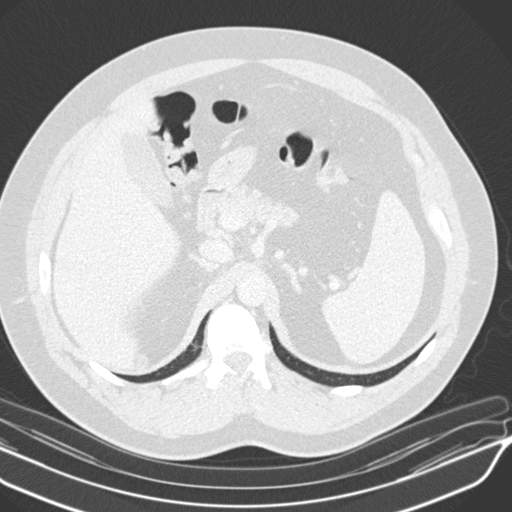
[im 23/151  lung]
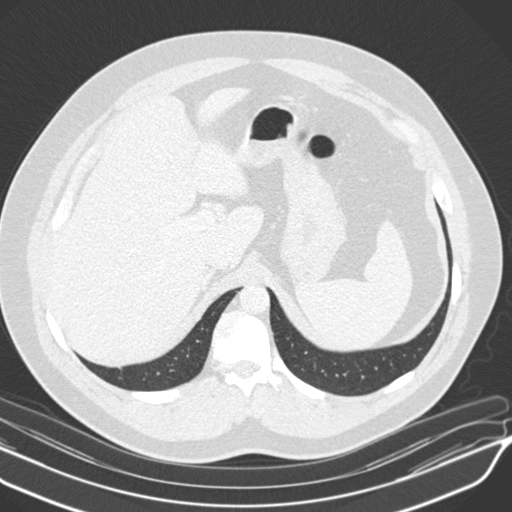
[im 34/151  lung]
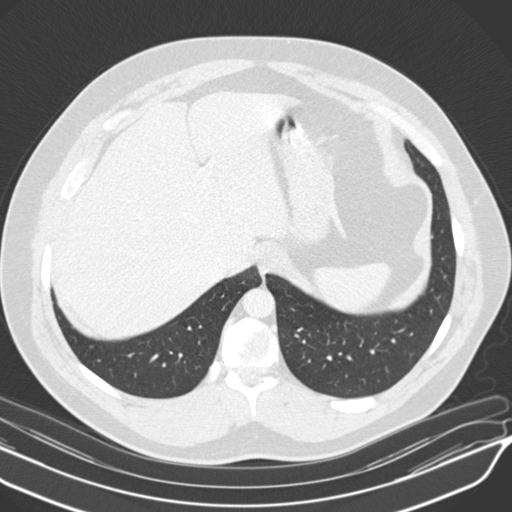
[im 45/151  lung]
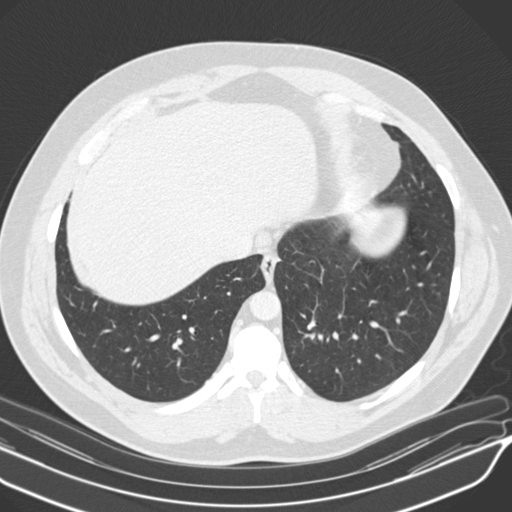
[im 56/151  mediastinal]
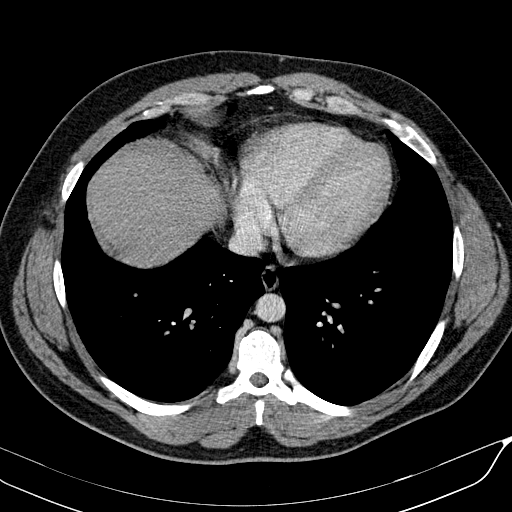
[im 56/151  lung]
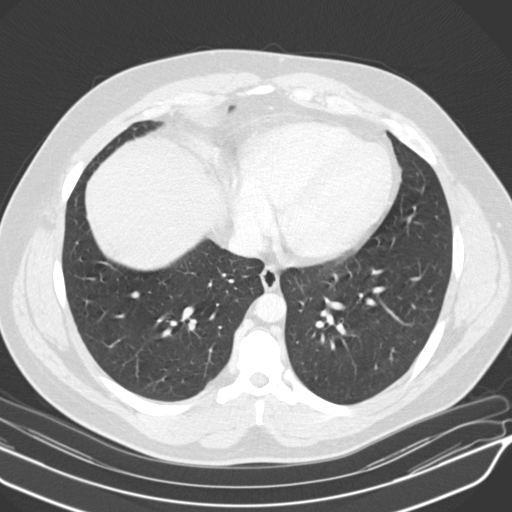
[im 67/151  lung]
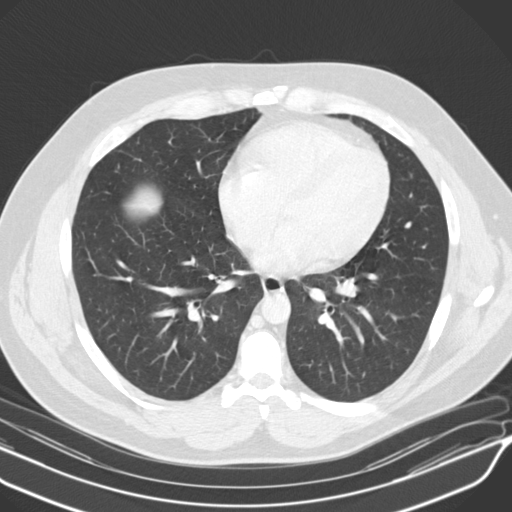
[im 84/151  lung]
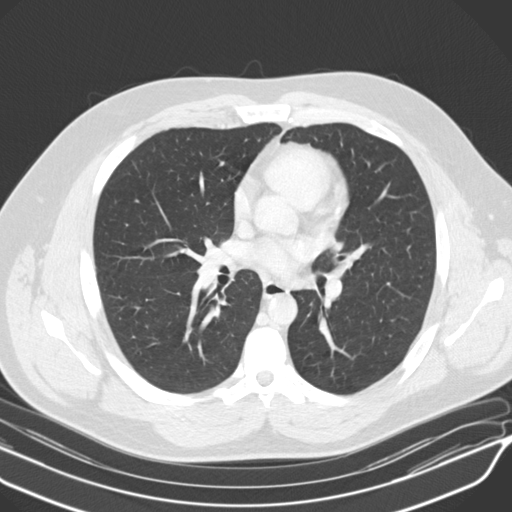
[im 95/151  lung]
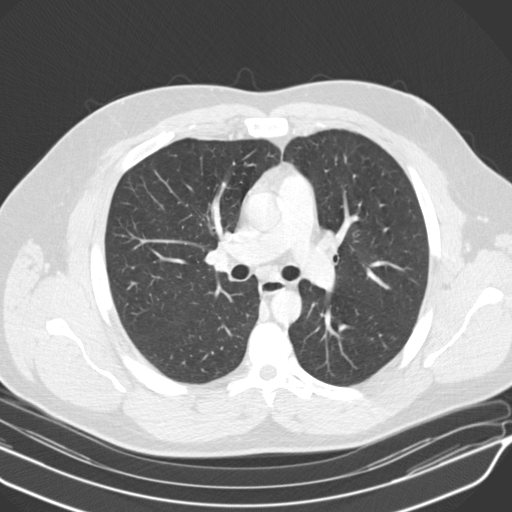
[im 106/151  mediastinal]
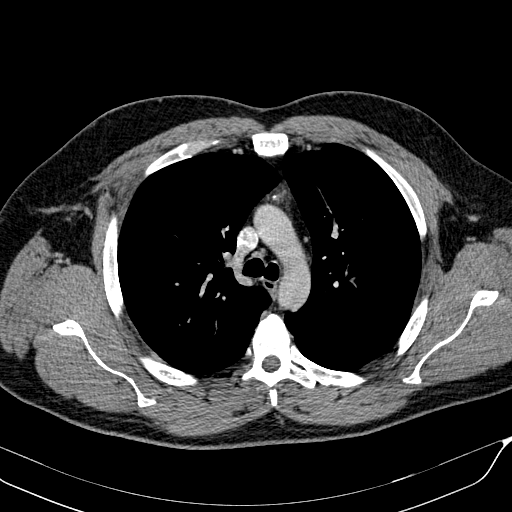
[im 106/151  lung]
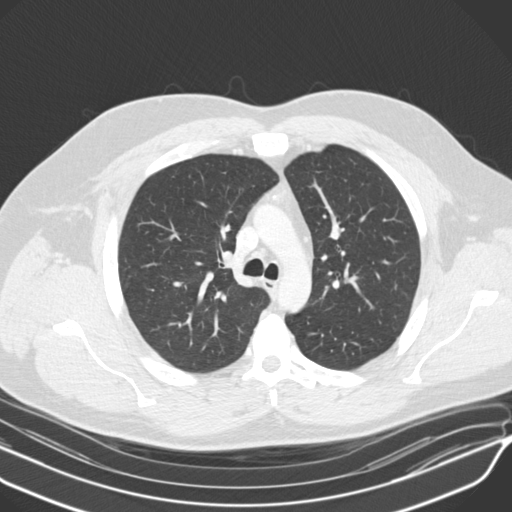
[im 117/151  lung]
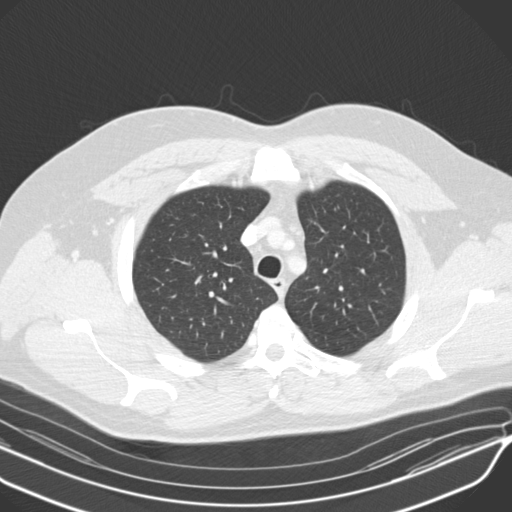
[im 128/151  lung]
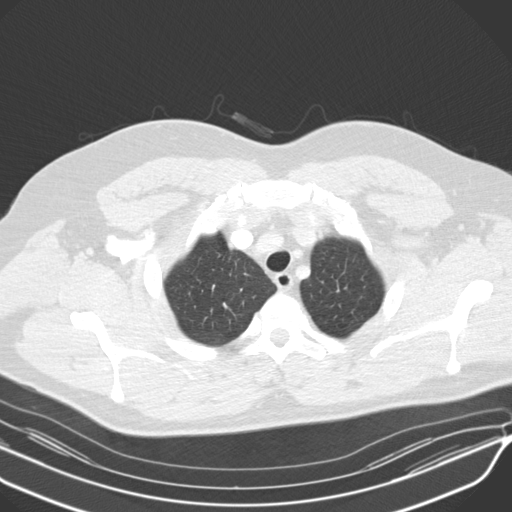
[im 139/151  lung]
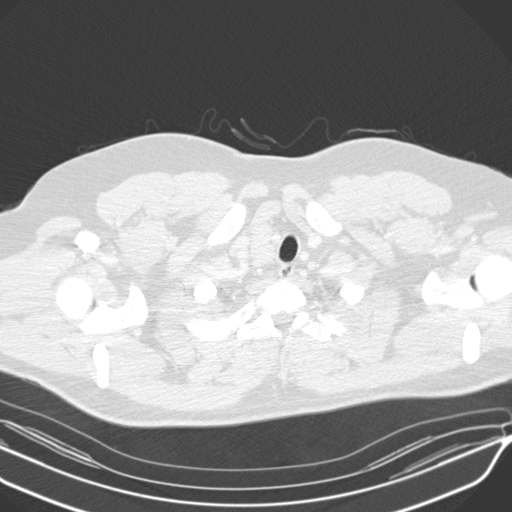

[Series 602: coronal · coronal · 0.75mm/px · 3 of 181 slices shown]
[im 37/181  lung]
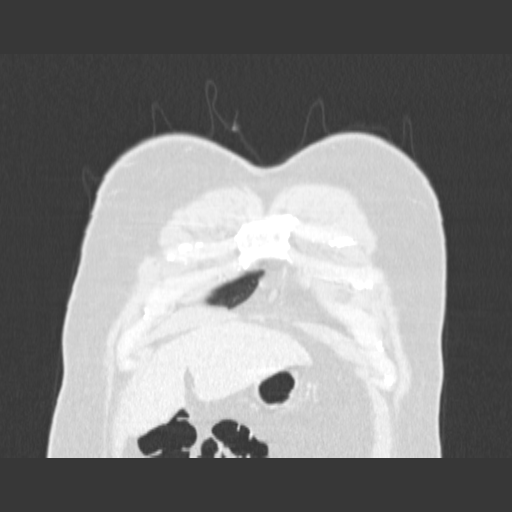
[im 73/181  lung]
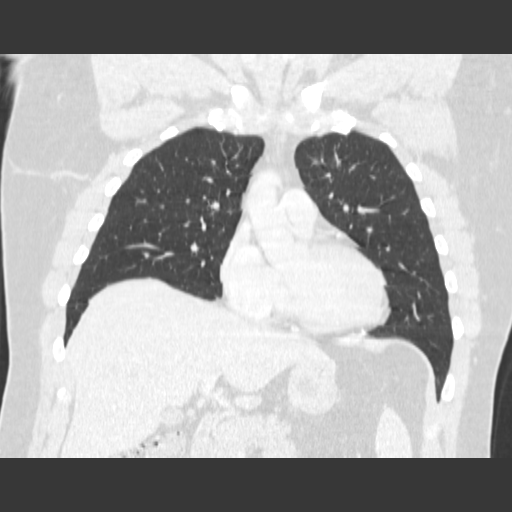
[im 109/181  lung]
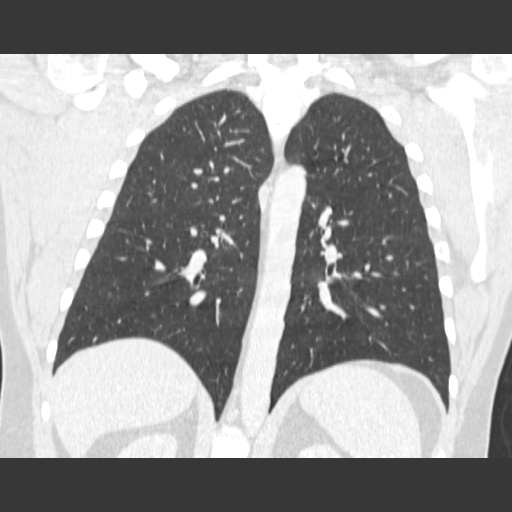

[15 of 36 positions shown; findings below may reference images not displayed]

FINDINGS: Cardiovascular:  No acute findings.

Mediastinum/Nodes: Right lower jugular and supraclavicular
lymphadenopathy is incompletely visualized on this exam. 10 mm
subcarinal mediastinal lymph node seen on image 57/4. A 10 mm
mediastinal lymph node is also seen along the anterior aspect of the
lower thoracic esophagus on image 107/4. No evidence of hilar or
axillary lymphadenopathy.

Lungs/Pleura: No pulmonary infiltrate or mass identified. No
effusion present.

Upper Abdomen:  Unremarkable.

Musculoskeletal:  No suspicious bone lesions.
IMPRESSION: Incompletely visualized right lower jugular and supraclavicular
lymphadenopathy. See separate neck CT report from today.

10 mm subcarinal and lower paraesophageal mediastinal lymph nodes.
No other lymphadenopathy or soft tissue masses identified within the
thorax.

## 2018-11-09 ENCOUNTER — Other Ambulatory Visit: Payer: Self-pay | Admitting: Primary Care

## 2018-11-28 ENCOUNTER — Telehealth: Payer: Self-pay

## 2018-11-28 NOTE — Telephone Encounter (Signed)
Left detailed VM w COVID screen and back door lab info and front door

## 2018-12-03 ENCOUNTER — Other Ambulatory Visit: Payer: BLUE CROSS/BLUE SHIELD

## 2018-12-09 ENCOUNTER — Encounter: Payer: BLUE CROSS/BLUE SHIELD | Admitting: Primary Care

## 2019-02-13 NOTE — Progress Notes (Signed)
[name]  Telephone:(336) [phone] Fax:(336) [phone]  ID: Wyatt Bates OB: 07/06/1975  MR#: [id number]  GYK#:[id number]  Patient Care Team: [name], NP as PCP - General (Internal Medicine)  I connected with Wyatt Bates on 02/18/19 at  2:45 PM EST by video enabled telemedicine visit and verified that I am speaking with the correct person using two identifiers.   I discussed the limitations, risks, security and privacy concerns of performing an evaluation and management service by telemedicine and the availability of in-person appointments. I also discussed with the patient that there may be a patient responsible charge related to this service. The patient expressed understanding and agreed to proceed.   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  He continues to feel well and remains asymptomatic. He denies any fevers, night sweats, or weight loss. He has no neurologic complaints. He denies any dysphagia or difficulty swallowing.  He denies any chest pain, shortness of breath, cough, or hemoptysis.  He denies any nausea, vomiting, constipation, or diarrhea. He has no urinary complaints.  Patient feels at his baseline and offers no specific complaints today.  REVIEW OF SYSTEMS:   Review of Systems  Constitutional: Negative.  Negative for fever, malaise/fatigue and weight loss.  HENT: Negative.   Respiratory: Negative.  Negative for cough and shortness of breath.   Cardiovascular: Negative.  Negative for chest pain and leg swelling.  Gastrointestinal: Negative.  Negative for abdominal pain, nausea and vomiting.  Genitourinary: Negative.   Negative for dysuria.  Musculoskeletal: Negative.  Negative for neck pain.  Skin: Negative.  Negative for rash.  Neurological: Negative.  Negative for sensory change and weakness.  Psychiatric/Behavioral: Negative.  The patient is not nervous/anxious.     As per HPI. Otherwise, a complete review of systems is negative.  PAST MEDICAL HISTORY: Past Medical History:  Diagnosis Date  . Medical history non-contributory   . Nodular sclerosis Hodgkin lymphoma of lymph nodes of neck ([place]) 03/2016   Chemo tx's with lymph node resection    PAST SURGICAL HISTORY: Past Surgical History:  Procedure Laterality Date  . LYMPH NODE BIOPSY Right 04/13/2016   Procedure: biopsy of lymph nodes open deep cervical node (right);  Surgeon: Margaretha Sheffield, MD;  Location: [facility];  Service: ENT;  Laterality: Right;  . NO PAST SURGERIES    . PORTA CATH INSERTION N/A 05/15/2016   Procedure: Glori Luis Cath Insertion;  Surgeon: Katha Cabal, MD;  Location: [facility];  Service: Cardiovascular;  Laterality: N/A;  . PORTA CATH REMOVAL N/A 02/25/2018   Procedure: PORTA CATH REMOVAL;  Surgeon: Katha Cabal, MD;  Location: [facility];  Service: Cardiovascular;  Laterality: N/A;    FAMILY HISTORY: Family History  Problem Relation Age of Onset  . Heart disease Father   . COPD Father   . Hypertension Father   . Hypertension Brother   . Alzheimer's disease Maternal Grandmother   . Pancreatic cancer Maternal Grandfather     ADVANCED DIRECTIVES (Y/N):  N  HEALTH MAINTENANCE: Social History   Tobacco Use  . Smoking status: Never Smoker  . Smokeless tobacco: Former Systems developer    Types: Snuff  . Tobacco comment: social in college  Substance Use Topics  . Alcohol use: Yes    Alcohol/week: 2.0 standard drinks    Types: 2 Glasses of wine per week  . Drug use: No     Colonoscopy:  PAP:  Bone density:  Lipid panel:  No Known Allergies  No current outpatient medications on  file.   Current Facility-Administered Medications  Medication Dose Route Frequency Provider Last Rate Last Dose  . ceFAZolin (ANCEF) IVPB 1 g/50 mL premix  1 g Intravenous Once Schnier, Dolores Lory, MD       Facility-Administered Medications Ordered in Other Visits  Medication Dose Route Frequency Provider Last Rate Last Dose  . heparin lock flush 100 unit/mL  500 Units Intravenous Once Lloyd Huger, MD      . sodium chloride flush (NS) 0.9 % injection 10 mL  10 mL Intravenous PRN Lloyd Huger, MD        OBJECTIVE: There were no vitals filed for this visit.   There is no height or weight on file to calculate BMI.    ECOG FS:0 - Asymptomatic  General: Well-developed, well-nourished, no acute distress. HEENT: Normocephalic. Neuro: Alert, answering all questions appropriately. Cranial nerves grossly intact. Psych: Normal affect. Lymphatics: No obvious cervical lymphadenopathy.  LAB RESULTS:  Lab Results  Component Value Date   NA 138 02/16/2019   K 4.2 02/16/2019   CL 104 02/16/2019   CO2 22 02/16/2019   GLUCOSE 102 (H) 02/16/2019   BUN 20 02/16/2019   CREATININE 0.98 02/16/2019   CALCIUM 8.9 02/16/2019   PROT 7.4 02/16/2019   ALBUMIN 4.2 02/16/2019   AST 28 02/16/2019   ALT 49 (H) 02/16/2019   ALKPHOS 93 02/16/2019   BILITOT 0.5 02/16/2019   GFRNONAA >60 02/16/2019   GFRAA >60 02/16/2019    Lab Results  Component Value Date   WBC 7.6 02/16/2019   NEUTROABS 4.4 02/16/2019   HGB 15.0 02/16/2019   HCT 43.7 02/16/2019   MCV 89.5 02/16/2019   PLT 243 02/16/2019     STUDIES: Ct Soft Tissue Neck W Contrast  Result Date: 02/16/2019 CLINICAL DATA:  Restaging Hodgkin's lymphoma diagnosed in 2018. EXAM: CT NECK WITH CONTRAST TECHNIQUE: Multidetector CT imaging of the neck was performed using the standard protocol following the bolus administration of intravenous contrast. CONTRAST:  81m OMNIPAQUE IOHEXOL 300 MG/ML  SOLN COMPARISON:  CT neck 08/11/2018  FINDINGS: Pharynx and larynx: Normal. No mass or swelling. Salivary glands: No inflammation, mass, or stone. Thyroid: Negative Lymph nodes: Cervical lymph nodes are stable. Multiple prominent lymph nodes are present in the right neck with low-density and minimal enhancement are stable. Right level 2 B lymph node 15 mm in short axis diameter. Posterior level 5 lymph nodes on the right also stable. Left level 2 lymph node 9 mm stable. No new adenopathy or progressive adenopathy in the neck. Vascular: Normal vascular enhancement Limited intracranial: Negative Visualized orbits: Negative Mastoids and visualized paranasal sinuses: Negative Skeleton: No acute skeletal lesion. Upper chest: Lung apices clear bilaterally. Other: None IMPRESSION: Stable CT neck. Enlarged lymph nodes in the right neck are low-density and have not changed in size or enhancement since the prior study. No new findings. Electronically Signed   By: CFranchot GalloM.D.   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  He completed 6 cycles of ABVD on October 26, 2016.  PET scan on January 24, 2017 revealed a complete metabolic response.  His most recent imaging with CT scan of the neck on February 16, 2019 reviewed independently and reported as above with no evidence of progressive or recurrent disease.  Secondary to cost, patient has requested that we only monitor CT of the neck since this was his primary site of disease although will consider adding CT of the chest, abdomen, pelvis with his next imaging.  Patient is now 2 years removed from completing his treatments and can be transitioned to laboratory work and evaluation every 6 months and yearly CT scans.  Patient will have video assisted telemedicine visit in 6 months for  routine evaluation.   I provided 15 minutes of face-to-face video visit time during this encounter, and > 50% was spent counseling as documented under my assessment & plan.  Patient expressed understanding and was in agreement with this plan. He also understands that He can call clinic at any time with any questions, concerns, or complaints.   Cancer Staging Nodular sclerosis Hodgkin lymphoma of lymph nodes of neck (HCC) Staging form: Hodgkin and Non-Hodgkin Lymphoma, AJCC 8th Edition - Clinical stage from 05/09/2016: Stage III (Hodgkin lymphoma, A - Asymptomatic) - Signed by Lloyd Huger, MD on 05/09/2016   Lloyd Huger, MD   02/18/2019 6:49 AM

## 2019-02-16 ENCOUNTER — Inpatient Hospital Stay: Payer: BC Managed Care – PPO | Attending: Oncology

## 2019-02-16 ENCOUNTER — Other Ambulatory Visit: Payer: Self-pay

## 2019-02-16 ENCOUNTER — Ambulatory Visit
Admission: RE | Admit: 2019-02-16 | Discharge: 2019-02-16 | Disposition: A | Discharge status: Home / Self Care | Payer: BC Managed Care – PPO | Source: Ambulatory Visit | Attending: Oncology | Admitting: Oncology

## 2019-02-16 DIAGNOSIS — Z8249 Family history of ischemic heart disease and other diseases of the circulatory system: Secondary | ICD-10-CM | POA: Diagnosis not present

## 2019-02-16 DIAGNOSIS — Z9221 Personal history of antineoplastic chemotherapy: Secondary | ICD-10-CM | POA: Diagnosis not present

## 2019-02-16 DIAGNOSIS — Z800 Family history of malignant neoplasm of digestive organs: Secondary | ICD-10-CM | POA: Diagnosis not present

## 2019-02-16 DIAGNOSIS — C8111 Nodular sclerosis classical Hodgkin lymphoma, lymph nodes of head, face, and neck: Secondary | ICD-10-CM

## 2019-02-16 LAB — CBC WITH DIFFERENTIAL/PLATELET
Abs Immature Granulocytes: 0.03 10*3/uL (ref 0.00–0.07)
Basophils Absolute: 0.1 10*3/uL (ref 0.0–0.1)
Basophils Relative: 1 %
Eosinophils Absolute: 0.4 10*3/uL (ref 0.0–0.5)
Eosinophils Relative: 6 %
HCT: 43.7 % (ref 39.0–52.0)
Hemoglobin: 15 g/dL (ref 13.0–17.0)
Immature Granulocytes: 0 %
Lymphocytes Relative: 25 %
Lymphs Abs: 1.9 10*3/uL (ref 0.7–4.0)
MCH: 30.7 pg (ref 26.0–34.0)
MCHC: 34.3 g/dL (ref 30.0–36.0)
MCV: 89.5 fL (ref 80.0–100.0)
Monocytes Absolute: 0.7 10*3/uL (ref 0.1–1.0)
Monocytes Relative: 9 %
Neutro Abs: 4.4 10*3/uL (ref 1.7–7.7)
Neutrophils Relative %: 59 %
Platelets: 243 10*3/uL (ref 150–400)
RBC: 4.88 MIL/uL (ref 4.22–5.81)
RDW: 12 % (ref 11.5–15.5)
WBC: 7.6 10*3/uL (ref 4.0–10.5)
nRBC: 0 % (ref 0.0–0.2)

## 2019-02-16 LAB — COMPREHENSIVE METABOLIC PANEL
ALT: 49 U/L — ABNORMAL HIGH (ref 0–44)
AST: 28 U/L (ref 15–41)
Albumin: 4.2 g/dL (ref 3.5–5.0)
Alkaline Phosphatase: 93 U/L (ref 38–126)
Anion gap: 12 (ref 5–15)
BUN: 20 mg/dL (ref 6–20)
CO2: 22 mmol/L (ref 22–32)
Calcium: 8.9 mg/dL (ref 8.9–10.3)
Chloride: 104 mmol/L (ref 98–111)
Creatinine, Ser: 0.98 mg/dL (ref 0.61–1.24)
GFR calc Af Amer: >60 mL/min (ref >60)
GFR calc non Af Amer: >60 mL/min (ref >60)
Glucose, Bld: 102 mg/dL — ABNORMAL HIGH (ref 70–99)
Potassium: 4.2 mmol/L (ref 3.5–5.1)
Sodium: 138 mmol/L (ref 135–145)
Total Bilirubin: 0.5 mg/dL (ref 0.3–1.2)
Total Protein: 7.4 g/dL (ref 6.5–8.1)

## 2019-02-16 MED ORDER — IOHEXOL 300 MG/ML  SOLN
75.0000 mL | Freq: Once | INTRAMUSCULAR | Status: AC | PRN
  Administered 2019-02-16: 75 mL via INTRAVENOUS

## 2019-02-16 NOTE — Progress Notes (Signed)
Patient pre screened for office appointment, no questions or concerns today. Patient reminded of upcoming appointment time and date. Unable to discontinue ABT needs a co-sign.

## 2019-02-17 ENCOUNTER — Inpatient Hospital Stay (HOSPITAL_BASED_OUTPATIENT_CLINIC_OR_DEPARTMENT_OTHER): Payer: BC Managed Care – PPO | Admitting: Oncology

## 2019-02-17 DIAGNOSIS — C8111 Nodular sclerosis classical Hodgkin lymphoma, lymph nodes of head, face, and neck: Secondary | ICD-10-CM | POA: Diagnosis not present

## 2019-04-08 ENCOUNTER — Other Ambulatory Visit: Payer: Self-pay

## 2019-04-08 ENCOUNTER — Ambulatory Visit (INDEPENDENT_AMBULATORY_CARE_PROVIDER_SITE_OTHER): Payer: BC Managed Care – PPO | Admitting: Primary Care

## 2019-04-08 DIAGNOSIS — F411 Generalized anxiety disorder: Secondary | ICD-10-CM

## 2019-04-08 DIAGNOSIS — R0989 Other specified symptoms and signs involving the circulatory and respiratory systems: Secondary | ICD-10-CM | POA: Insufficient documentation

## 2019-04-08 MED ORDER — AZITHROMYCIN 250 MG PO TABS: ORAL_TABLET | ORAL | 0 refills | Status: DC

## 2019-04-08 NOTE — Assessment & Plan Note (Addendum)
Chronic for one year, symptoms progressing over the last several months. GAD 7 score of 16 today.  Discussed options for treatment including therapy and medication, he opts to [facility] therapist. We had a long discussion regarding potentially starting citalopram daily, he will think about this and update.

## 2019-04-08 NOTE — Addendum Note (Signed)
Addended by: [name] on: 04/08/2019 10:48 AM   Modules accepted: Orders

## 2019-04-08 NOTE — Assessment & Plan Note (Signed)
Acute for the last month, progressing with expulsion of green/brown sputum.  He appears well but insists that Zpak helped last time, this feels the same.  Discussed use of Zyrtec nightly for PND. Given cancer history Rx for Zpack provided.

## 2019-04-08 NOTE — Patient Instructions (Signed)
Please meet with the therapist at your [facility] and update me as discussed.  Start Azithromycin antibiotics for infection. Take 2 tablets by mouth today, then 1 tablet daily for 4 additional days.  Start Zyrtec 10 once daily at night for post nasal drip.   It was a pleasure to see you today!

## 2019-04-08 NOTE — Progress Notes (Signed)
Subjective:    Patient ID: Wyatt Bates, male    DOB: [birth year], 44 y.o.   MRN: [id number]  HPI  Virtual Visit via Video Note  I connected with Wyatt Bates on 04/08/19 at  9:20 AM EST by a video enabled telemedicine application and verified that I am speaking with the correct person using two identifiers.  Location: Patient: Home Provider: Office   I discussed the limitations of evaluation and management by telemedicine and the availability of in person appointments. The patient expressed understanding and agreed to proceed.  History of Present Illness:  Wyatt Bates is a 44 year old male with a history of elevated blood pressure readings, Hodgkin lymphoma of neck who presents today with a chief complaint of chest congestion. He would also like to discuss anxiety/stress.  1) Chest Congestion: He also reports morning post nasal drip with productive cough (clear, sometimes greenish/brown), improves throughout the day. This began several months ago.   He was evaluated by a [facility] service two months ago for same symptoms, they suggested symptoms were viral in etiology, wasn't provided with treatment.   He's taken Mucinex DM with temporary improvement. He denies fevers, chills, fatigue. He would like a prescription for Zpak as this feels exactly like his "bronchitis infection" last year. After treated last year his symptoms resolved.   2) Anxiety/Stress: He would also like to discuss his stress level and sleep disturbance. Symptoms include irritability, feeling anxious, managing stress, difficulty sleeping, mind racing thoughts about work. Symptoms began one year ago, prior to Covid-19, and has started to affect his home life. GAD 7 score of 16 today. He's never been treated in the past but is ready for treatment.     Observations/Objective:  Alert and oriented. Appears well, not sickly. No distress. Speaking in complete sentences. Congested cough during visit.  Assessment and  Plan:  See problem based charting.  Follow Up Instructions:  Please meet with the therapist at your church and update me as discussed.  Start Azithromycin antibiotics for infection. Take 2 tablets by mouth today, then 1 tablet daily for 4 additional days.  Start Zyrtec 10 once daily at night for post nasal drip.   It was a pleasure to see you today!    I discussed the assessment and treatment plan with the patient. The patient was provided an opportunity to ask questions and all were answered. The patient agreed with the plan and demonstrated an understanding of the instructions.   The patient was advised to call back or seek an in-person evaluation if the symptoms worsen or if the condition fails to improve as anticipated.   [name], NP    Review of Systems  HENT: Positive for congestion, postnasal drip and sore throat.   Respiratory: Positive for cough. Negative for shortness of breath.   Cardiovascular: Negative for chest pain.  Psychiatric/Behavioral: The patient is nervous/anxious.        See HPI       Past Medical History:  Diagnosis Date  . Medical history non-contributory   . 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 Marital status: Married    Spouse name: Not on file  . Number of children: Not on file  . Years of education: Not on file  . Highest education level: Not on file  Occupational History  . Not on  file  Tobacco Use  . Smoking status: Never Smoker  . Smokeless tobacco: Former Systems developer    Types: Snuff  . Tobacco comment: social in college  Substance and Sexual Activity  . Alcohol use: Yes    Alcohol/week: 2.0 standard drinks    Types: 2 Glasses of wine per week  . Drug use: No  . Sexual activity: Not on file  Other Topics Concern  . Not on file  Social History Narrative   Married.   2 children.   Works in [employer].    Enjoys golf, baseball, hunting, spending time with family.   Social Determinants of Health   Financial Resource Strain:   . Difficulty of Paying Living Expenses: Not on file  Food Insecurity:   . Worried About Charity fundraiser in the Last Year: Not on file  . Ran Out of Food in the Last Year: Not on file  Transportation Needs:   . Lack of Transportation (Medical): Not on file  . Lack of Transportation (Non-Medical): Not on file  Physical Activity:   . Days of Exercise per Week: Not on file  . Minutes of Exercise per Session: Not on file  Stress:   . Feeling of Stress : Not on file  Social Connections:   . Frequency of Communication with Friends and Family: Not on file  . Frequency of Social Gatherings with Friends and Family: Not on file  . Attends Religious Services: Not on file  . Active Member of Clubs or Organizations: Not on file  . Attends Archivist Meetings: Not on file  . Marital Status: Not on file  Intimate Partner Violence:   . Fear of Current or Ex-Partner: Not on file  . Emotionally Abused: Not on file  . Physically Abused: Not on file  . Sexually Abused: Not on file    Past Surgical History:  Procedure Laterality Date  . LYMPH NODE BIOPSY Right 04/13/2016   Procedure: biopsy of lymph nodes open deep cervical node (right);  Surgeon: Margaretha Sheffield, MD;  Location: [facility];  Service: ENT;  Laterality: Right;  . NO PAST SURGERIES    . PORTA CATH INSERTION N/A 05/15/2016   Procedure: Glori Luis Cath Insertion;  Surgeon: Katha Cabal, MD;  Location: [facility];  Service: Cardiovascular;  Laterality: N/A;  . PORTA CATH REMOVAL N/A 02/25/2018   Procedure: PORTA CATH REMOVAL;  Surgeon: Katha Cabal, MD;  Location: [facility];  Service: Cardiovascular;  Laterality: N/A;    Family History  Problem Relation Age of Onset  . Heart disease Father   . COPD Father   . Hypertension Father   . Hypertension Brother   .  Alzheimer's disease Maternal Grandmother   . Pancreatic cancer Maternal Grandfather     No Known Allergies  No current outpatient medications on file prior to visit.   Current Facility-Administered Medications on File Prior to Visit  Medication Dose Route Frequency Provider Last Rate Last Admin  . ceFAZolin (ANCEF) IVPB 1 g/50 mL premix  1 g Intravenous Once Schnier, Dolores Lory, MD      . heparin lock flush 100 unit/mL  500 Units Intravenous Once Lloyd Huger, MD      . sodium chloride flush (NS) 0.9 % injection 10 mL  10 mL Intravenous PRN Lloyd Huger, MD        There were no vitals taken for this visit.   Objective:   Physical Exam  Constitutional: He  is oriented to person, place, and time. He appears well-nourished.  Respiratory: Effort normal.  Congested cough during visit  Neurological: He is alert and oriented to person, place, and time.  Psychiatric: He has a normal mood and affect.           Assessment & Plan:

## 2019-08-14 ENCOUNTER — Encounter: Payer: Self-pay | Admitting: Oncology

## 2019-08-14 NOTE — Progress Notes (Signed)
Patient prescreened for appointment. Patient has no concerns or questions.  

## 2019-08-15 NOTE — Progress Notes (Signed)
[facility]  [phone] [phone]  ID: Wyatt Bates OB: 26-Nov-1975  MR#: [id number]  LPF#:[id number]  Patient Care Team: [name], NP as PCP - General (Internal Medicine) Lloyd Huger, MD as Consulting Physician (Oncology)  I connected with Wyatt Bates on 08/18/19 at  2:45 PM EDT by video enabled telemedicine visit and verified that I am speaking with the correct person using two identifiers.   I discussed the limitations, risks, security and privacy concerns of performing an evaluation and management service by telemedicine and the availability of in-person appointments. I also discussed with the patient that there may be a patient responsible charge related to this service. The patient expressed understanding and agreed to proceed.   Other persons participating in the visit and their role in the encounter: Patient, MD.  Patient's location: Home. Provider's location: Clinic.  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  He continues to feel well and remains asymptomatic.  He denies any fevers, night sweats, or weight loss. He has no neurologic complaints. He denies any dysphagia or difficulty swallowing.  He denies any chest pain, shortness of breath, cough, or hemoptysis.  He denies any nausea, vomiting, constipation, or diarrhea. He has no urinary complaints.  Patient offers no specific complaints today.  REVIEW OF SYSTEMS:   Review of Systems  Constitutional: Negative.  Negative for fever, malaise/fatigue and weight loss.  HENT: Negative.   Respiratory: Negative.  Negative for cough and shortness of breath.   Cardiovascular: Negative.  Negative for chest pain and leg swelling.  Gastrointestinal: Negative.  Negative for abdominal pain, nausea and  vomiting.  Genitourinary: Negative.  Negative for dysuria.  Musculoskeletal: Negative.  Negative for neck pain.  Skin: Negative.  Negative for rash.  Neurological: Negative.  Negative for sensory change and weakness.  Psychiatric/Behavioral: Negative.  The patient is not nervous/anxious.     As per HPI. Otherwise, a complete review of systems is negative.  PAST MEDICAL HISTORY: Past Medical History:  Diagnosis Date  . Medical history non-contributory   . 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 LYMPH NODE BIOPSY Right 04/13/2016   Procedure: biopsy of lymph nodes open deep cervical node (right);  Surgeon: Margaretha Sheffield, MD;  Location: [facility];  Service: ENT;  Laterality: Right;  . NO PAST SURGERIES    . PORTA CATH INSERTION N/A 05/15/2016   Procedure: Glori Luis Cath Insertion;  Surgeon: Katha Cabal, MD;  Location: [facility];  Service: Cardiovascular;  Laterality: N/A;  . PORTA CATH REMOVAL N/A 02/25/2018   Procedure: PORTA CATH REMOVAL;  Surgeon: Katha Cabal, MD;  Location: [facility];  Service: Cardiovascular;  Laterality: N/A;    FAMILY HISTORY: Family History  Problem Relation Age of Onset  . Heart disease Father   . COPD Father   . Hypertension Father   . Hypertension Brother   . Alzheimer's disease Maternal Grandmother   . Pancreatic cancer Maternal Grandfather     ADVANCED DIRECTIVES (Y/N):  N  HEALTH MAINTENANCE: Social History   Tobacco Use  . Smoking status: Never Smoker  . Smokeless tobacco: Former Systems developer    Types: Snuff  . Tobacco  comment: social in college  Substance Use Topics  . Alcohol use: Yes    Alcohol/week: 2.0 standard drinks    Types: 2 Glasses of wine per week  . Drug use: No     Colonoscopy:  PAP:  Bone density:  Lipid panel:  No Known  Allergies  Current Outpatient Medications  Medication Sig Dispense Refill  . cetirizine (ZYRTEC) 10 MG tablet Take 10 mg by mouth daily.     Current Facility-Administered Medications  Medication Dose Route Frequency Provider Last Rate Last Admin  . ceFAZolin (ANCEF) IVPB 1 g/50 mL premix  1 g Intravenous Once Schnier, Gregory G, MD       Facility-Administered Medications Ordered in Other Visits  Medication Dose Route Frequency Provider Last Rate Last Admin  . heparin lock flush 100 unit/mL  500 Units Intravenous Once Finnegan, Timothy J, MD      . sodium chloride flush (NS) 0.9 % injection 10 mL  10 mL Intravenous PRN Finnegan, Timothy J, MD        OBJECTIVE: There were no vitals filed for this visit.   There is no height or weight on file to calculate BMI.    ECOG FS:0 - Asymptomatic  General: Well-developed, well-nourished, no acute distress. HEENT: Normocephalic.  No visible cervical lymphadenopathy. Neuro: Alert, answering all questions appropriately. Cranial nerves grossly intact. Psych: Normal affect.   LAB RESULTS:  Lab Results  Component Value Date   NA 140 08/17/2019   K 4.2 08/17/2019   CL 108 08/17/2019   CO2 17 (L) 08/17/2019   GLUCOSE 105 (H) 08/17/2019   BUN 19 08/17/2019   CREATININE 0.74 08/17/2019   CALCIUM 8.5 (L) 08/17/2019   PROT 7.3 08/17/2019   ALBUMIN 4.2 08/17/2019   AST 33 08/17/2019   ALT 44 08/17/2019   ALKPHOS 98 08/17/2019   BILITOT 0.8 08/17/2019   GFRNONAA >60 08/17/2019   GFRAA >60 08/17/2019    Lab Results  Component Value Date   WBC 6.7 08/17/2019   NEUTROABS 4.1 08/17/2019   HGB 13.5 08/17/2019   HCT 39.8 08/17/2019   MCV 88.8 08/17/2019   PLT 505 (H) 08/17/2019     STUDIES: No results found.  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  He completed 6 cycles of ABVD on October 26, 2016.  PET scan on January 24, 2017 revealed a complete metabolic response.  His most recent imaging with CT scan of the neck on February 16, 2019 reviewed independently with no evidence of progressive or recurrent disease.  Secondary to cost, patient has requested that we only monitor CT of the neck since this was his primary site of disease.  Patient is over 2 years removed from completing his treatments and can have laboratory work and evaluation every 6 months and yearly CT scans.  Return to clinic in 6 months with repeat laboratory work and imaging followed by video assisted telemedicine visit. 2.  Thrombocytosis: Likely reactive, monitor. 3.  Eosinophilia: Patient states he has significant seasonal allergies.  I provided 30 minutes of face-to-face video visit time during this encounter which included chart review, counseling, and coordination of care as documented above.   Patient expressed understanding and was in agreement with this plan. He also understands that He can call clinic at any time with   any questions, concerns, or complaints.   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

## 2019-08-17 ENCOUNTER — Inpatient Hospital Stay: Payer: BC Managed Care – PPO | Attending: Oncology

## 2019-08-17 ENCOUNTER — Inpatient Hospital Stay (HOSPITAL_BASED_OUTPATIENT_CLINIC_OR_DEPARTMENT_OTHER): Payer: BC Managed Care – PPO | Admitting: Oncology

## 2019-08-17 ENCOUNTER — Other Ambulatory Visit: Payer: Self-pay

## 2019-08-17 DIAGNOSIS — C8111 Nodular sclerosis classical Hodgkin lymphoma, lymph nodes of head, face, and neck: Secondary | ICD-10-CM

## 2019-08-17 DIAGNOSIS — Z9221 Personal history of antineoplastic chemotherapy: Secondary | ICD-10-CM | POA: Insufficient documentation

## 2019-08-17 DIAGNOSIS — D721 Eosinophilia, unspecified: Secondary | ICD-10-CM | POA: Insufficient documentation

## 2019-08-17 DIAGNOSIS — R7989 Other specified abnormal findings of blood chemistry: Secondary | ICD-10-CM | POA: Insufficient documentation

## 2019-08-17 DIAGNOSIS — Z79899 Other long term (current) drug therapy: Secondary | ICD-10-CM | POA: Diagnosis not present

## 2019-08-17 LAB — COMPREHENSIVE METABOLIC PANEL
ALT: 44 U/L (ref 0–44)
AST: 33 U/L (ref 15–41)
Albumin: 4.2 g/dL (ref 3.5–5.0)
Alkaline Phosphatase: 98 U/L (ref 38–126)
Anion gap: 15 (ref 5–15)
BUN: 19 mg/dL (ref 6–20)
CO2: 17 mmol/L — ABNORMAL LOW (ref 22–32)
Calcium: 8.5 mg/dL — ABNORMAL LOW (ref 8.9–10.3)
Chloride: 108 mmol/L (ref 98–111)
Creatinine, Ser: 0.74 mg/dL (ref 0.61–1.24)
GFR calc Af Amer: >60 mL/min (ref >60)
GFR calc non Af Amer: >60 mL/min (ref >60)
Glucose, Bld: 105 mg/dL — ABNORMAL HIGH (ref 70–99)
Potassium: 4.2 mmol/L (ref 3.5–5.1)
Sodium: 140 mmol/L (ref 135–145)
Total Bilirubin: 0.8 mg/dL (ref 0.3–1.2)
Total Protein: 7.3 g/dL (ref 6.5–8.1)

## 2019-08-17 LAB — CBC WITH DIFFERENTIAL/PLATELET
Abs Immature Granulocytes: 0.06 10*3/uL (ref 0.00–0.07)
Basophils Absolute: 0 10*3/uL (ref 0.0–0.1)
Basophils Relative: 1 %
Eosinophils Absolute: 0.6 10*3/uL — ABNORMAL HIGH (ref 0.0–0.5)
Eosinophils Relative: 9 %
HCT: 39.8 % (ref 39.0–52.0)
Hemoglobin: 13.5 g/dL (ref 13.0–17.0)
Immature Granulocytes: 1 %
Lymphocytes Relative: 18 %
Lymphs Abs: 1.2 10*3/uL (ref 0.7–4.0)
MCH: 30.1 pg (ref 26.0–34.0)
MCHC: 33.9 g/dL (ref 30.0–36.0)
MCV: 88.8 fL (ref 80.0–100.0)
Monocytes Absolute: 0.6 10*3/uL (ref 0.1–1.0)
Monocytes Relative: 9 %
Neutro Abs: 4.1 10*3/uL (ref 1.7–7.7)
Neutrophils Relative %: 62 %
Platelets: 505 10*3/uL — ABNORMAL HIGH (ref 150–400)
RBC: 4.48 MIL/uL (ref 4.22–5.81)
RDW: 14.7 % (ref 11.5–15.5)
Smear Review: ADEQUATE
WBC: 6.7 10*3/uL (ref 4.0–10.5)
nRBC: 4.3 % — ABNORMAL HIGH (ref 0.0–0.2)

## 2019-10-07 ENCOUNTER — Telehealth (INDEPENDENT_AMBULATORY_CARE_PROVIDER_SITE_OTHER): Payer: BC Managed Care – PPO | Admitting: Primary Care

## 2019-10-07 DIAGNOSIS — J019 Acute sinusitis, unspecified: Secondary | ICD-10-CM | POA: Diagnosis not present

## 2019-10-07 MED ORDER — AZITHROMYCIN 250 MG PO TABS: ORAL_TABLET | ORAL | 0 refills | Status: AC

## 2019-10-07 NOTE — Assessment & Plan Note (Signed)
One week history of sinus pressure, feeling about the same today. Little improvement with numerous OTC regimens.  Given immune compromise, coupled with symptoms we will treat. Rx for Zpak sent to pharmacy, historically works well.  Continue Zyrtec and Flonase.  He will update in a few days.

## 2019-10-07 NOTE — Patient Instructions (Signed)
Start Azithromycin antibiotics for infection. Take 2 tablets by mouth today, then 1 tablet daily for 4 additional days.  Continue Flonase and Zyrtec as discussed.  Please update me in a few days.  It was a pleasure to see you today! Allie Bossier, NP-C

## 2019-10-07 NOTE — Progress Notes (Signed)
Subjective:    Patient ID: Wyatt Bates, male    DOB: [birth year], 44 y.o.   MRN: [id number]  HPI  Virtual Visit via Video Note  I connected with Wyatt Bates on 10/07/19 at  8:20 AM EDT by a video enabled telemedicine application and verified that I am speaking with the correct person using two identifiers.  Location: Patient: Home Provider: Office Participants: Myself and patient   I discussed the limitations of evaluation and management by telemedicine and the availability of in person appointments. The patient expressed understanding and agreed to proceed.  History of Present Illness:  This visit occurred during the SARS-CoV-2 public health emergency.  Safety protocols were in place, including screening questions prior to the visit, additional usage of staff PPE, and extensive cleaning of exam room while observing appropriate contact time as indicated for disinfecting solutions.   Wyatt Bates is a 44 year old male with a history of elevated blood pressure, Hodgkin lymphoma of neck, GAD who presents today with a chief complaint of sinus pressure.   Symptoms began one week ago with itchy ear, scratchy throat, ear fullness. He then developed rhinorrhea, chest congestion, gum pain productive cough, maxillary sinus pressure.  He's been taking Zyrtec during the day, Benadryl at night, and Flonase BID. Over the last several days he's started taking Mucinex. He's been outdoors playing in golf tournaments. He tested negative for Covid-19 a few weeks ago. He did complete the Covid-19 vaccines, last Pfizer vaccine occurred one week ago when symptoms began. He had symptoms of myalgias and neuropathy just after his last Covid-19 vaccine, which have subsided.    Observations/Objective:  Alert and oriented. Appears well, not sickly. No distress. Speaking in complete sentences.  Assessment and Plan:  One week history of sinus pressure, feeling about the same today. Little improvement  with numerous OTC regimens.  Given immune compromise, coupled with symptoms we will treat. Rx for Zpak sent to pharmacy, historically works well.  Continue Zyrtec and Flonase.  He will update in a few days.  Follow Up Instructions:  Start Azithromycin antibiotics for infection. Take 2 tablets by mouth today, then 1 tablet daily for 4 additional days.  Continue Flonase and Zyrtec as discussed.  Please update me in a few days.  It was a pleasure to see you today! Allie Bossier, NP-C    I discussed the assessment and treatment plan with the patient. The patient was provided an opportunity to ask questions and all were answered. The patient agreed with the plan and demonstrated an understanding of the instructions.   The patient was advised to call back or seek an in-person evaluation if the symptoms worsen or if the condition fails to improve as anticipated.    [name], NP    Review of Systems  Constitutional: Negative for chills, fatigue and fever.  HENT: Positive for congestion, rhinorrhea, sinus pressure and sinus pain.   Respiratory: Positive for cough. Negative for shortness of breath.   Allergic/Immunologic: Positive for environmental allergies.       Past Medical History:  Diagnosis Date  . Medical history non-contributory   . 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 Marital status: Married    Spouse name: Not on file  . Number of children: Not on file  . Years of education: Not on file  . Highest  education level: Not on file  Occupational History  . Not on file  Tobacco Use  . Smoking status: Never Smoker  . Smokeless tobacco: Former Systems developer    Types: Snuff  . Tobacco comment: social in college  Substance and Sexual Activity  . Alcohol use: Yes    Alcohol/week: 2.0 standard drinks    Types: 2 Glasses of wine per week  . Drug use: No   . Sexual activity: Not on file  Other Topics Concern  . Not on file  Social History Narrative   Married.   2 children.   Works in [employer].   Enjoys golf, baseball, hunting, spending time with family.   Social Determinants of Health   Financial Resource Strain:   . Difficulty of Paying Living Expenses:   Food Insecurity:   . Worried About Charity fundraiser in the Last Year:   . Arboriculturist in the Last Year:   Transportation Needs:   . Film/video editor (Medical):   [name] Lack of Transportation (Non-Medical):   Physical Activity:   . Days of Exercise per Week:   . Minutes of Exercise per Session:   Stress:   . Feeling of Stress :   Social Connections:   . Frequency of Communication with Friends and Family:   . Frequency of Social Gatherings with Friends and Family:   . Attends Religious Services:   . Active Member of Clubs or Organizations:   . Attends Archivist Meetings:   [name] Marital Status:   Intimate Partner Violence:   . Fear of Current or Ex-Partner:   . Emotionally Abused:   [name] Physically Abused:   . Sexually Abused:     Past Surgical History:  Procedure Laterality Date  . LYMPH NODE BIOPSY Right 04/13/2016   Procedure: biopsy of lymph nodes open deep cervical node (right);  Surgeon: Margaretha Sheffield, MD;  Location: [facility];  Service: ENT;  Laterality: Right;  . NO PAST SURGERIES    . PORTA CATH INSERTION N/A 05/15/2016   Procedure: Glori Luis Cath Insertion;  Surgeon: Katha Cabal, MD;  Location: [facility];  Service: Cardiovascular;  Laterality: N/A;  . PORTA CATH REMOVAL N/A 02/25/2018   Procedure: PORTA CATH REMOVAL;  Surgeon: Katha Cabal, MD;  Location: [facility];  Service: Cardiovascular;  Laterality: N/A;    Family History  Problem Relation Age of Onset  . Heart disease Father   . COPD Father   . Hypertension Father   . Hypertension Brother   . Alzheimer's disease Maternal Grandmother   . Pancreatic  cancer Maternal Grandfather     No Known Allergies  Current Outpatient Medications on File Prior to Visit  Medication Sig Dispense Refill  . cetirizine (ZYRTEC) 10 MG tablet Take 10 mg by mouth daily.     Current Facility-Administered Medications on File Prior to Visit  Medication Dose Route Frequency Provider Last Rate Last Admin  . ceFAZolin (ANCEF) IVPB 1 g/50 mL premix  1 g Intravenous Once Schnier, Dolores Lory, MD      . heparin lock flush 100 unit/mL  500 Units Intravenous Once Lloyd Huger, MD      . sodium chloride flush (NS) 0.9 % injection 10 mL  10 mL Intravenous PRN Lloyd Huger, MD        There were no vitals taken for this visit.   Objective:   Physical Exam Constitutional:      General: He  is not in acute distress.    Appearance: Normal appearance. He is not ill-appearing.  Pulmonary:     Effort: Pulmonary effort is normal.     Comments: No cough during visit Neurological:     Mental Status: He is alert and oriented to person, place, and time.            Assessment & Plan:

## 2020-02-13 NOTE — Progress Notes (Signed)
[place]  [phone]) [phone] Fax:(336) [phone]  ID: Wyatt Bates OB: [birth year]  MR#: [id number]  [facility]#:[id number]  Patient Care Team: [name], NP as PCP - General (Internal Medicine) Lloyd Huger, MD as Consulting Physician (Oncology)  I connected with Wyatt Bates on 02/16/20 at  2:30 PM EST by video enabled telemedicine visit and verified that I am speaking with the correct person using two identifiers.   I discussed the limitations, risks, security and privacy concerns of performing an evaluation and management service by telemedicine and the availability of in-person appointments. I also discussed with the patient that there may be a patient responsible charge related to this service. The patient expressed understanding and agreed to proceed.   Other persons participating in the visit and their role in the encounter: Patient, MD.  Patient's location: Home. Provider's location: Clinic.  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  He continues to feel well and remains asymptomatic. He denies any fevers, night sweats, or weight loss. He has no neurologic complaints. He denies any dysphagia or difficulty swallowing.  He denies any chest pain, shortness of breath, cough, or hemoptysis.  He denies any nausea, vomiting, constipation, or diarrhea. He has no urinary complaints.  Patient feels at his baseline offers no specific complaints today.  REVIEW OF SYSTEMS:   Review of Systems  Constitutional: Negative.  Negative for fever, malaise/fatigue and weight loss.  HENT: Negative.   Respiratory: Negative.  Negative for cough and shortness of breath.   Cardiovascular: Negative.  Negative for chest pain and leg swelling.  Gastrointestinal: Negative.  Negative  for abdominal pain, nausea and vomiting.  Genitourinary: Negative.  Negative for dysuria.  Musculoskeletal: Negative.  Negative for neck pain.  Skin: Negative.  Negative for rash.  Neurological: Negative.  Negative for sensory change and weakness.  Psychiatric/Behavioral: Negative.  The patient is not nervous/anxious.     As per HPI. Otherwise, a complete review of systems is negative.  PAST MEDICAL HISTORY: Past Medical History:  Diagnosis Date  . Medical history non-contributory   . 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 LYMPH NODE BIOPSY Right 04/13/2016   Procedure: biopsy of lymph nodes open deep cervical node (right);  Surgeon: Margaretha Sheffield, MD;  Location: [facility];  Service: ENT;  Laterality: Right;  . NO PAST SURGERIES    . PORTA CATH INSERTION N/A 05/15/2016   Procedure: Glori Luis Cath Insertion;  Surgeon: Katha Cabal, MD;  Location: [facility];  Service: Cardiovascular;  Laterality: N/A;  . PORTA CATH REMOVAL N/A 02/25/2018   Procedure: PORTA CATH REMOVAL;  Surgeon: Katha Cabal, MD;  Location: [facility];  Service: Cardiovascular;  Laterality: N/A;    FAMILY HISTORY: Family History  Problem Relation Age of Onset  . Heart disease Father   . COPD Father   . Hypertension Father   . Hypertension Brother   . Alzheimer's disease Maternal Grandmother   . Pancreatic cancer Maternal Grandfather     ADVANCED DIRECTIVES (Y/N):  N  HEALTH MAINTENANCE: Social History   Tobacco Use  . Smoking status: Never Smoker  . Smokeless tobacco: Former Systems developer  Types: Snuff  . Tobacco comment: social in college  Substance Use Topics  . Alcohol use: Yes    Alcohol/week: 2.0 standard drinks    Types: 2 Glasses of wine per week  . Drug use: No     Colonoscopy:  PAP:  Bone density:  Lipid  panel:  No Known Allergies  Current Outpatient Medications  Medication Sig Dispense Refill  . azithromycin (ZITHROMAX) 250 MG tablet Take 2 tablets by mouth today, then 1 tablet daily for 4 additional days. (Patient not taking: Reported on 02/16/2020) 6 tablet 0  . cetirizine (ZYRTEC) 10 MG tablet Take 10 mg by mouth daily. (Patient not taking: Reported on 02/16/2020)     Current Facility-Administered Medications  Medication Dose Route Frequency Provider Last Rate Last Admin  . ceFAZolin (ANCEF) IVPB 1 g/50 mL premix  1 g Intravenous Once Schnier, Dolores Lory, MD       Facility-Administered Medications Ordered in Other Visits  Medication Dose Route Frequency Provider Last Rate Last Admin  . heparin lock flush 100 unit/mL  500 Units Intravenous Once Lloyd Huger, MD      . sodium chloride flush (NS) 0.9 % injection 10 mL  10 mL Intravenous PRN Lloyd Huger, MD        OBJECTIVE: There were no vitals filed for this visit.   There is no height or weight on file to calculate BMI.    ECOG FS:0 - Asymptomatic  General: Well-developed, well-nourished, no acute distress. HEENT: Normocephalic. Neuro: Alert, answering all questions appropriately. Cranial nerves grossly intact. Psych: Normal affect.   LAB RESULTS:  Lab Results  Component Value Date   NA 138 02/15/2020   K 4.0 02/15/2020   CL 102 02/15/2020   CO2 23 02/15/2020   GLUCOSE 101 (H) 02/15/2020   BUN 17 02/15/2020   CREATININE 1.04 02/15/2020   CALCIUM 8.9 02/15/2020   PROT 7.9 02/15/2020   ALBUMIN 4.5 02/15/2020   AST 27 02/15/2020   ALT 40 02/15/2020   ALKPHOS 78 02/15/2020   BILITOT 0.7 02/15/2020   GFRNONAA >60 02/15/2020   GFRAA >60 08/17/2019    Lab Results  Component Value Date   WBC 5.3 02/15/2020   NEUTROABS 3.2 02/15/2020   HGB 15.0 02/15/2020   HCT 42.5 02/15/2020   MCV 87.3 02/15/2020   PLT 224 02/15/2020     STUDIES: CT SOFT TISSUE NECK W CONTRAST  Result Date:  02/15/2020 CLINICAL DATA:  Restaging of Hodgkin's lymphoma. EXAM: CT NECK WITH CONTRAST TECHNIQUE: Multidetector CT imaging of the neck was performed using the standard protocol following the bolus administration of intravenous contrast. CONTRAST:  26m OMNIPAQUE IOHEXOL 300 MG/ML  SOLN COMPARISON:  02/16/2019 FINDINGS: Pharynx and larynx: No evidence of mass or swelling. Widely patent airway. No fluid collection or inflammatory changes in the parapharyngeal or retropharyngeal spaces. Salivary glands: No inflammation, mass, or stone. Thyroid: Unchanged 4 mm left thyroid nodule which is considered clinically insignificant and for which no imaging follow-up is recommended. Lymph nodes: Right level III and IV lymph nodes remain hypoattenuating and are all stable to 1-2 mm smaller in short axis measurement with the largest measuring 12 mm in level III. No progressive lymphadenopathy is seen in the right neck, and there are no enlarged or suspicious lymph nodes on the left. Vascular: Major vascular structures of the neck are patent. Limited intracranial: Unremarkable. Visualized orbits: Unremarkable. Mastoids and visualized paranasal sinuses: Clear. Skeleton: No acute osseous abnormality or suspicious osseous lesion. Upper chest: Clear  lung apices. Other: None. IMPRESSION: Stable to slightly decreased size of multiple enlarged right cervical lymph nodes. No new or progressive findings. Electronically Signed   By: Logan Bores M.D.   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  He completed 6 cycles of ABVD on October 26, 2016.  PET scan on January 24, 2017 revealed a complete metabolic response.  His most recent imaging with CT scan of the neck on February 15, 2020 reviewed independently and  reported as above with no obvious evidence of recurrent or progressive disease.  No intervention is needed at this time.  Patient is now greater than 3 years removed from completing treatment and can be followed with laboratory work and evaluation every 6 months and imaging once per year.  His next imaging is due in November 2022 at which point will do a full body CT scan including neck, chest, abdomen, pelvis.  Patient will have video assisted telemedicine visit in 6 months for laboratory work and routine evaluation.   2.  Thrombocytosis: Resolved. 3.  Eosinophilia: Resolved.  I provided 20 minutes of face-to-face video visit time during this encounter which included chart review, counseling, and coordination of care as documented above.    Patient expressed understanding and was in agreement with this plan. He also understands that He can call clinic at any time with any questions, concerns, or complaints.   Cancer Staging Nodular sclerosis Hodgkin lymphoma of lymph nodes of neck ([facility]) Staging form: Hodgkin and Non-Hodgkin Lymphoma, AJCC 8th Edition - Clinical stage from 05/09/2016: Stage III (Hodgkin lymphoma, A - Asymptomatic) - Signed by Lloyd Huger, MD on 05/09/2016   Lloyd Huger, MD   02/16/2020 4:21 PM

## 2020-02-15 ENCOUNTER — Ambulatory Visit
Admission: RE | Admit: 2020-02-15 | Discharge: 2020-02-15 | Disposition: A | Discharge status: Home / Self Care | Payer: BC Managed Care – PPO | Source: Ambulatory Visit | Attending: Oncology | Admitting: Oncology

## 2020-02-15 ENCOUNTER — Other Ambulatory Visit: Payer: Self-pay

## 2020-02-15 ENCOUNTER — Inpatient Hospital Stay: Payer: BC Managed Care – PPO | Attending: Oncology

## 2020-02-15 ENCOUNTER — Encounter: Payer: Self-pay | Admitting: Oncology

## 2020-02-15 DIAGNOSIS — Z800 Family history of malignant neoplasm of digestive organs: Secondary | ICD-10-CM | POA: Diagnosis not present

## 2020-02-15 DIAGNOSIS — Z8571 Personal history of Hodgkin lymphoma: Secondary | ICD-10-CM | POA: Diagnosis not present

## 2020-02-15 DIAGNOSIS — Z9221 Personal history of antineoplastic chemotherapy: Secondary | ICD-10-CM | POA: Diagnosis not present

## 2020-02-15 DIAGNOSIS — Z8249 Family history of ischemic heart disease and other diseases of the circulatory system: Secondary | ICD-10-CM | POA: Insufficient documentation

## 2020-02-15 DIAGNOSIS — C8111 Nodular sclerosis classical Hodgkin lymphoma, lymph nodes of head, face, and neck: Secondary | ICD-10-CM | POA: Insufficient documentation

## 2020-02-15 LAB — COMPREHENSIVE METABOLIC PANEL
ALT: 40 U/L (ref 0–44)
AST: 27 U/L (ref 15–41)
Albumin: 4.5 g/dL (ref 3.5–5.0)
Alkaline Phosphatase: 78 U/L (ref 38–126)
Anion gap: 13 (ref 5–15)
BUN: 17 mg/dL (ref 6–20)
CO2: 23 mmol/L (ref 22–32)
Calcium: 8.9 mg/dL (ref 8.9–10.3)
Chloride: 102 mmol/L (ref 98–111)
Creatinine, Ser: 1.04 mg/dL (ref 0.61–1.24)
GFR, Estimated: >60 mL/min (ref >60)
Glucose, Bld: 101 mg/dL — ABNORMAL HIGH (ref 70–99)
Potassium: 4 mmol/L (ref 3.5–5.1)
Sodium: 138 mmol/L (ref 135–145)
Total Bilirubin: 0.7 mg/dL (ref 0.3–1.2)
Total Protein: 7.9 g/dL (ref 6.5–8.1)

## 2020-02-15 LAB — CBC WITH DIFFERENTIAL/PLATELET
Abs Immature Granulocytes: 0.02 10*3/uL (ref 0.00–0.07)
Basophils Absolute: 0.1 10*3/uL (ref 0.0–0.1)
Basophils Relative: 1 %
Eosinophils Absolute: 0.2 10*3/uL (ref 0.0–0.5)
Eosinophils Relative: 4 %
HCT: 42.5 % (ref 39.0–52.0)
Hemoglobin: 15 g/dL (ref 13.0–17.0)
Immature Granulocytes: 0 %
Lymphocytes Relative: 24 %
Lymphs Abs: 1.3 10*3/uL (ref 0.7–4.0)
MCH: 30.8 pg (ref 26.0–34.0)
MCHC: 35.3 g/dL (ref 30.0–36.0)
MCV: 87.3 fL (ref 80.0–100.0)
Monocytes Absolute: 0.6 10*3/uL (ref 0.1–1.0)
Monocytes Relative: 11 %
Neutro Abs: 3.2 10*3/uL (ref 1.7–7.7)
Neutrophils Relative %: 60 %
Platelets: 224 10*3/uL (ref 150–400)
RBC: 4.87 MIL/uL (ref 4.22–5.81)
RDW: 12.2 % (ref 11.5–15.5)
WBC: 5.3 10*3/uL (ref 4.0–10.5)
nRBC: 0 % (ref 0.0–0.2)

## 2020-02-15 MED ORDER — IOHEXOL 300 MG/ML  SOLN
75.0000 mL | Freq: Once | INTRAMUSCULAR | Status: AC | PRN
  Administered 2020-02-15: 75 mL via INTRAVENOUS

## 2020-02-16 ENCOUNTER — Encounter: Payer: Self-pay | Admitting: Oncology

## 2020-02-16 ENCOUNTER — Inpatient Hospital Stay (HOSPITAL_BASED_OUTPATIENT_CLINIC_OR_DEPARTMENT_OTHER): Payer: BC Managed Care – PPO | Admitting: Oncology

## 2020-02-16 DIAGNOSIS — C8111 Nodular sclerosis classical Hodgkin lymphoma, lymph nodes of head, face, and neck: Secondary | ICD-10-CM | POA: Diagnosis not present

## 2020-08-15 ENCOUNTER — Inpatient Hospital Stay: Payer: BC Managed Care – PPO | Attending: Oncology

## 2020-08-15 ENCOUNTER — Inpatient Hospital Stay (HOSPITAL_BASED_OUTPATIENT_CLINIC_OR_DEPARTMENT_OTHER): Payer: BC Managed Care – PPO | Admitting: Oncology

## 2020-08-15 DIAGNOSIS — M545 Low back pain, unspecified: Secondary | ICD-10-CM | POA: Diagnosis not present

## 2020-08-15 DIAGNOSIS — C8111 Nodular sclerosis classical Hodgkin lymphoma, lymph nodes of head, face, and neck: Secondary | ICD-10-CM | POA: Insufficient documentation

## 2020-08-15 LAB — COMPREHENSIVE METABOLIC PANEL
ALT: 43 U/L (ref 0–44)
AST: 26 U/L (ref 15–41)
Albumin: 4.4 g/dL (ref 3.5–5.0)
Alkaline Phosphatase: 80 U/L (ref 38–126)
Anion gap: 10 (ref 5–15)
BUN: 20 mg/dL (ref 6–20)
CO2: 23 mmol/L (ref 22–32)
Calcium: 8.7 mg/dL — ABNORMAL LOW (ref 8.9–10.3)
Chloride: 103 mmol/L (ref 98–111)
Creatinine, Ser: 0.97 mg/dL (ref 0.61–1.24)
GFR, Estimated: >60 mL/min (ref >60)
Glucose, Bld: 104 mg/dL — ABNORMAL HIGH (ref 70–99)
Potassium: 4.1 mmol/L (ref 3.5–5.1)
Sodium: 136 mmol/L (ref 135–145)
Total Bilirubin: 0.9 mg/dL (ref 0.3–1.2)
Total Protein: 7.7 g/dL (ref 6.5–8.1)

## 2020-08-15 LAB — CBC WITH DIFFERENTIAL/PLATELET
Abs Immature Granulocytes: 0.03 10*3/uL (ref 0.00–0.07)
Basophils Absolute: 0.1 10*3/uL (ref 0.0–0.1)
Basophils Relative: 1 %
Eosinophils Absolute: 0.4 10*3/uL (ref 0.0–0.5)
Eosinophils Relative: 8 %
HCT: 43.8 % (ref 39.0–52.0)
Hemoglobin: 15 g/dL (ref 13.0–17.0)
Immature Granulocytes: 1 %
Lymphocytes Relative: 29 %
Lymphs Abs: 1.6 10*3/uL (ref 0.7–4.0)
MCH: 29.8 pg (ref 26.0–34.0)
MCHC: 34.2 g/dL (ref 30.0–36.0)
MCV: 87.1 fL (ref 80.0–100.0)
Monocytes Absolute: 0.6 10*3/uL (ref 0.1–1.0)
Monocytes Relative: 11 %
Neutro Abs: 2.7 10*3/uL (ref 1.7–7.7)
Neutrophils Relative %: 50 %
Platelets: 217 10*3/uL (ref 150–400)
RBC: 5.03 MIL/uL (ref 4.22–5.81)
RDW: 12.6 % (ref 11.5–15.5)
WBC: 5.4 10*3/uL (ref 4.0–10.5)
nRBC: 0 % (ref 0.0–0.2)

## 2020-08-15 NOTE — Progress Notes (Signed)
[place]  [phone]) [phone] Fax:(336) [phone]  ID: Wyatt Bates OB: Mar 09, 1976  MR#: [id number]  KKX#:[id number]  Patient Care Team: [name], NP as PCP - General (Internal Medicine) Lloyd Huger, MD as Consulting Physician (Oncology) Lloyd Huger, MD as Consulting Physician (Hematology and Oncology)  I connected with Wyatt Bates on 08/22/20 at  2:30 PM EDT by video enabled telemedicine visit and verified that I am speaking with the correct person using two identifiers.   I discussed the limitations, risks, security and privacy concerns of performing an evaluation and management service by telemedicine and the availability of in-person appointments. I also discussed with the patient that there may be a patient responsible charge related to this service. The patient expressed understanding and agreed to proceed.   Other persons participating in the visit and their role in the encounter: Patient, MD.  Patient's location: Home. Provider's location: Clinic.  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 He was last seen on 02/15/21.   CT soft tissue neck from 02/15/2020 showed stable to slightly decreased size of multiple and large to right cervical lymph nodes.  No new or progressive findings.   He continues to feel well and remains asymptomatic. He has occasional low back that radiates down his legs. Thinks this is due to recently starting to work out. He denies any fevers, night sweats, or weight loss. He has no neurologic complaints. He denies any dysphagia or difficulty swallowing.  He denies any chest pain, shortness of breath, cough, or hemoptysis.  He denies any nausea, vomiting, constipation, or diarrhea. He has no urinary complaints.  Patient  feels at his baseline offers no specific complaints today.  REVIEW OF SYSTEMS:   Review of Systems  Constitutional: Negative.  Negative for fever, malaise/fatigue and weight loss.  HENT: Negative.   Respiratory: Negative.  Negative for cough and shortness of breath.   Cardiovascular: Negative.  Negative for chest pain and leg swelling.  Gastrointestinal: Negative.  Negative for abdominal pain, nausea and vomiting.  Genitourinary: Negative.  Negative for dysuria.  Musculoskeletal: Positive for back pain and joint pain. Negative for neck pain.  Skin: Negative.  Negative for rash.  Neurological: Negative.  Negative for sensory change and weakness.  Psychiatric/Behavioral: Negative.  The patient is not nervous/anxious.     As per HPI. Otherwise, a complete review of systems is negative.  PAST MEDICAL HISTORY: Past Medical History:  Diagnosis Date  . Medical history non-contributory   . Nodular sclerosis Hodgkin lymphoma of lymph nodes of neck ([place]) 03/2016   Chemo tx's with lymph node resection    PAST SURGICAL HISTORY: Past Surgical History:  Procedure Laterality Date  . LYMPH NODE BIOPSY Right 04/13/2016   Procedure: biopsy of lymph nodes open deep cervical node (right);  Surgeon: Margaretha Sheffield, MD;  Location: [facility];  Service: ENT;  Laterality: Right;  . NO PAST SURGERIES    . PORTA CATH INSERTION N/A 05/15/2016   Procedure: Glori Luis Cath Insertion;  Surgeon: Katha Cabal, MD;  Location: [facility];  Service: Cardiovascular;  Laterality: N/A;  . PORTA CATH REMOVAL N/A 02/25/2018   Procedure: PORTA CATH REMOVAL;  Surgeon: Katha Cabal, MD;  Location: [facility];  Service: Cardiovascular;  Laterality: N/A;    FAMILY HISTORY: Family History  Problem  Relation Age of Onset  . Heart disease Father   . COPD Father   . Hypertension Father   . Hypertension Brother   . Alzheimer's disease Maternal Grandmother   . Pancreatic cancer Maternal  Grandfather     ADVANCED DIRECTIVES (Y/N):  N  HEALTH MAINTENANCE: Social History   Tobacco Use  . Smoking status: Never Smoker  . Smokeless tobacco: Former Systems developer    Types: Snuff  . Tobacco comment: social in college  Substance Use Topics  . Alcohol use: Yes    Alcohol/week: 2.0 standard drinks    Types: 2 Glasses of wine per week  . Drug use: No     Colonoscopy:  PAP:  Bone density:  Lipid panel:  No Known Allergies  Current Outpatient Medications  Medication Sig Dispense Refill  . azithromycin (ZITHROMAX) 250 MG tablet Take 2 tablets by mouth today, then 1 tablet daily for 4 additional days. (Patient not taking: Reported on 02/16/2020) 6 tablet 0  . cetirizine (ZYRTEC) 10 MG tablet Take 10 mg by mouth daily. (Patient not taking: Reported on 02/16/2020)     Current Facility-Administered Medications  Medication Dose Route Frequency Provider Last Rate Last Admin  . ceFAZolin (ANCEF) IVPB 1 g/50 mL premix  1 g Intravenous Once Schnier, Dolores Lory, MD       Facility-Administered Medications Ordered in Other Visits  Medication Dose Route Frequency Provider Last Rate Last Admin  . heparin lock flush 100 unit/mL  500 Units Intravenous Once Lloyd Huger, MD      . sodium chloride flush (NS) 0.9 % injection 10 mL  10 mL Intravenous PRN Lloyd Huger, MD        OBJECTIVE: There were no vitals filed for this visit.   There is no height or weight on file to calculate BMI.    ECOG FS:0 - Asymptomatic  General: Well-developed, well-nourished, no acute distress. HEENT: Normocephalic. Neuro: Alert, answering all questions appropriately. Cranial nerves grossly intact. Psych: Normal affect.   LAB RESULTS:  Lab Results  Component Value Date   NA 136 08/15/2020   K 4.1 08/15/2020   CL 103 08/15/2020   CO2 23 08/15/2020   GLUCOSE 104 (H) 08/15/2020   BUN 20 08/15/2020   CREATININE 0.97 08/15/2020   CALCIUM 8.7 (L) 08/15/2020   PROT 7.7 08/15/2020   ALBUMIN 4.4  08/15/2020   AST 26 08/15/2020   ALT 43 08/15/2020   ALKPHOS 80 08/15/2020   BILITOT 0.9 08/15/2020   GFRNONAA >60 08/15/2020   GFRAA >60 08/17/2019    Lab Results  Component Value Date   WBC 5.4 08/15/2020   NEUTROABS 2.7 08/15/2020   HGB 15.0 08/15/2020   HCT 43.8 08/15/2020   MCV 87.1 08/15/2020   PLT 217 08/15/2020     STUDIES: No results found.  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 -PET scan from 01/24/2017 revealed a complete metabolic response. -CT scan of his neck on 02/15/2020 without evidence of recurrent or progressive disease. -No intervention needed at this time. -We will repeat labs every 6 months and imaging once per year. - He would like to hold off on CT neck chest and abdomen until 2023 due to recent switch insurance. - We will get him scheduled for CT neck only.    2. Low back pain -Likely secondary to  working out -Continues to play golf weekly -Feels its worse when standing  -Feels this is improving but will let his PCP know if symptoms worsen.  Disposition: RTC as scheduled for scans CT neck only on 02/14/21.  Will cancel CT CAP.   I provided 20 minutes of face-to-face video visit time during this encounter which included chart review, counseling, and coordination of care as documented above.  Patient expressed understanding and was in agreement with this plan. He also understands that He can call clinic at any time with any questions, concerns, or complaints.   Cancer Staging Nodular sclerosis Hodgkin lymphoma of lymph nodes of neck ([place]) Staging form: Hodgkin and Non-Hodgkin Lymphoma, AJCC 8th Edition - Clinical stage from 05/09/2016: Stage III (Hodgkin lymphoma, A - Asymptomatic) - Signed by Lloyd Huger, MD on 05/09/2016   Jacquelin Hawking, NP   08/22/2020 6:33  AM

## 2021-02-13 ENCOUNTER — Other Ambulatory Visit: Payer: Self-pay

## 2021-02-13 DIAGNOSIS — C8111 Nodular sclerosis classical Hodgkin lymphoma, lymph nodes of head, face, and neck: Secondary | ICD-10-CM

## 2021-02-14 ENCOUNTER — Other Ambulatory Visit: Payer: Self-pay

## 2021-02-14 ENCOUNTER — Ambulatory Visit
Admission: RE | Admit: 2021-02-14 | Discharge: 2021-02-14 | Disposition: A | Discharge status: Home / Self Care | Payer: Self-pay | Source: Ambulatory Visit | Attending: Oncology | Admitting: Oncology

## 2021-02-14 ENCOUNTER — Ambulatory Visit: Payer: BC Managed Care – PPO

## 2021-02-14 ENCOUNTER — Inpatient Hospital Stay: Payer: BC Managed Care – PPO | Attending: Oncology

## 2021-02-14 DIAGNOSIS — C8111 Nodular sclerosis classical Hodgkin lymphoma, lymph nodes of head, face, and neck: Secondary | ICD-10-CM | POA: Insufficient documentation

## 2021-02-14 LAB — COMPREHENSIVE METABOLIC PANEL
ALT: 46 U/L — ABNORMAL HIGH (ref 0–44)
AST: 23 U/L (ref 15–41)
Albumin: 4.5 g/dL (ref 3.5–5.0)
Alkaline Phosphatase: 99 U/L (ref 38–126)
Anion gap: 9 (ref 5–15)
BUN: 24 mg/dL — ABNORMAL HIGH (ref 6–20)
CO2: 24 mmol/L (ref 22–32)
Calcium: 8.9 mg/dL (ref 8.9–10.3)
Chloride: 103 mmol/L (ref 98–111)
Creatinine, Ser: 0.96 mg/dL (ref 0.61–1.24)
GFR, Estimated: >60 mL/min (ref >60)
Glucose, Bld: 108 mg/dL — ABNORMAL HIGH (ref 70–99)
Potassium: 3.9 mmol/L (ref 3.5–5.1)
Sodium: 136 mmol/L (ref 135–145)
Total Bilirubin: 0.7 mg/dL (ref 0.3–1.2)
Total Protein: 8.1 g/dL (ref 6.5–8.1)

## 2021-02-14 LAB — CBC WITH DIFFERENTIAL/PLATELET
Abs Immature Granulocytes: 0.02 10*3/uL (ref 0.00–0.07)
Basophils Absolute: 0.1 10*3/uL (ref 0.0–0.1)
Basophils Relative: 1 %
Eosinophils Absolute: 0.7 10*3/uL — ABNORMAL HIGH (ref 0.0–0.5)
Eosinophils Relative: 11 %
HCT: 43.8 % (ref 39.0–52.0)
Hemoglobin: 15.3 g/dL (ref 13.0–17.0)
Immature Granulocytes: 0 %
Lymphocytes Relative: 26 %
Lymphs Abs: 1.6 10*3/uL (ref 0.7–4.0)
MCH: 30.6 pg (ref 26.0–34.0)
MCHC: 34.9 g/dL (ref 30.0–36.0)
MCV: 87.6 fL (ref 80.0–100.0)
Monocytes Absolute: 0.6 10*3/uL (ref 0.1–1.0)
Monocytes Relative: 10 %
Neutro Abs: 3.2 10*3/uL (ref 1.7–7.7)
Neutrophils Relative %: 52 %
Platelets: 245 10*3/uL (ref 150–400)
RBC: 5 MIL/uL (ref 4.22–5.81)
RDW: 12 % (ref 11.5–15.5)
WBC: 6.3 10*3/uL (ref 4.0–10.5)
nRBC: 0 % (ref 0.0–0.2)

## 2021-02-14 MED ORDER — IOHEXOL 300 MG/ML  SOLN
75.0000 mL | Freq: Once | INTRAMUSCULAR | Status: AC | PRN
  Administered 2021-02-14: 75 mL via INTRAVENOUS

## 2021-02-20 NOTE — Progress Notes (Signed)
[facility]  [phone] [phone]  ID: Wyatt Bates OB: [birth year]  MR#: [id number]  EYC#:[id number]  Patient Care Team: [name], NP as PCP - General (Internal Medicine) Lloyd Huger, MD as Consulting Physician (Oncology) Lloyd Huger, MD as Consulting Physician (Hematology and Oncology)  I connected with Wyatt Bates on 02/22/21 at  2:30 PM EST by video enabled telemedicine visit and verified that I am speaking with the correct person using two identifiers.   I discussed the limitations, risks, security and privacy concerns of performing an evaluation and management service by telemedicine and the availability of in-person appointments. I also discussed with the patient that there may be a patient responsible charge related to this service. The patient expressed understanding and agreed to proceed.   Other persons participating in the visit and their role in the encounter: Patient, MD.  Patient's location: Home. Provider's location: Clinic.   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  He is anxious, but otherwise feels well and is asymptomatic.  He denies any fevers, night sweats, or weight loss. He has no neurologic complaints. He denies any dysphagia or difficulty swallowing.  He denies any chest pain, shortness of breath, cough, or hemoptysis.  He denies any nausea, vomiting, constipation, or diarrhea. He has no urinary complaints.  Patient otherwise feels well and offers no further specific complaints today.  REVIEW OF SYSTEMS:   Review of Systems  Constitutional: Negative.  Negative for fever, malaise/fatigue and weight loss.  HENT: Negative.    Respiratory: Negative.  Negative for cough and shortness of breath.    Cardiovascular: Negative.  Negative for chest pain and leg swelling.  Gastrointestinal: Negative.  Negative for abdominal pain, nausea and vomiting.  Genitourinary: Negative.  Negative for dysuria.  Musculoskeletal: Negative.  Negative for neck pain.  Skin: Negative.  Negative for rash.  Neurological: Negative.  Negative for sensory change and weakness.  Psychiatric/Behavioral:  The patient is nervous/anxious.    As per HPI. Otherwise, a complete review of systems is negative.  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 ([place]) 03/2016   Chemo tx's with lymph node resection    PAST SURGICAL HISTORY: Past Surgical History:  Procedure Laterality Date   LYMPH NODE BIOPSY Right 04/13/2016   Procedure: biopsy of lymph nodes open deep cervical node (right);  Surgeon: Margaretha Sheffield, MD;  Location: [place];  Service: ENT;  Laterality: Right;   NO PAST SURGERIES     PORTA CATH INSERTION N/A 05/15/2016   Procedure: Glori Luis Cath Insertion;  Surgeon: Katha Cabal, MD;  Location: [facility];  Service: Cardiovascular;  Laterality: N/A;   PORTA CATH REMOVAL N/A 02/25/2018   Procedure: PORTA CATH REMOVAL;  Surgeon: Katha Cabal, MD;  Location: [place] [facility];  Service: Cardiovascular;  Laterality: N/A;    FAMILY HISTORY: Family History  Problem Relation Age of Onset   Heart disease Father    COPD Father    Hypertension Father    Hypertension Brother    Alzheimer's disease Maternal Grandmother    Pancreatic cancer Maternal Grandfather     ADVANCED DIRECTIVES (Y/N):  N  HEALTH MAINTENANCE: Social History   Tobacco Use  Smoking status: Never   Smokeless tobacco: Former    Types: Snuff    Quit date: 05/25/2015   Tobacco comments:    social in college  Substance Use Topics   Alcohol use: Yes    Alcohol/week: 2.0 standard drinks    Types: 2  Glasses of wine per week   Drug use: No     Colonoscopy:  PAP:  Bone density:  Lipid panel:  No Known Allergies  Current Outpatient Medications  Medication Sig Dispense Refill   azithromycin (ZITHROMAX) 250 MG tablet Take 2 tablets by mouth today, then 1 tablet daily for 4 additional days. (Patient not taking: Reported on 02/16/2020) 6 tablet 0   cetirizine (ZYRTEC) 10 MG tablet Take 10 mg by mouth daily. (Patient not taking: Reported on 02/16/2020)     Current Facility-Administered Medications  Medication Dose Route Frequency Provider Last Rate Last Admin   ceFAZolin (ANCEF) IVPB 1 g/50 mL premix  1 g Intravenous Once Schnier, Dolores Lory, MD       Facility-Administered Medications Ordered in Other Visits  Medication Dose Route Frequency Provider Last Rate Last Admin   heparin lock flush 100 unit/mL  500 Units Intravenous Once Lloyd Huger, MD       sodium chloride flush (NS) 0.9 % injection 10 mL  10 mL Intravenous PRN Lloyd Huger, MD        OBJECTIVE: There were no vitals filed for this visit.   There is no height or weight on file to calculate BMI.    ECOG FS:0 - Asymptomatic  General: Well-developed, well-nourished, no acute distress. HEENT: Normocephalic. Neuro: Alert, answering all questions appropriately. Cranial nerves grossly intact. Psych: Normal affect.   LAB RESULTS:  Lab Results  Component Value Date   NA 136 02/14/2021   K 3.9 02/14/2021   CL 103 02/14/2021   CO2 24 02/14/2021   GLUCOSE 108 (H) 02/14/2021   BUN 24 (H) 02/14/2021   CREATININE 0.96 02/14/2021   CALCIUM 8.9 02/14/2021   PROT 8.1 02/14/2021   ALBUMIN 4.5 02/14/2021   AST 23 02/14/2021   ALT 46 (H) 02/14/2021   ALKPHOS 99 02/14/2021   BILITOT 0.7 02/14/2021   GFRNONAA >60 02/14/2021   GFRAA >60 08/17/2019    Lab Results  Component Value Date   WBC 6.3 02/14/2021   NEUTROABS 3.2 02/14/2021   HGB 15.3 02/14/2021   HCT 43.8 02/14/2021   MCV 87.6 02/14/2021   PLT 245  02/14/2021     STUDIES: CT SOFT TISSUE NECK W CONTRAST  Result Date: 02/15/2021 CLINICAL DATA:  Restaging of hydro can slim foam EXAM: CT NECK WITH CONTRAST TECHNIQUE: Multidetector CT imaging of the neck was performed using the standard protocol following the bolus administration of intravenous contrast. CONTRAST:  61m OMNIPAQUE IOHEXOL 300 MG/ML  SOLN COMPARISON:  02/15/2020 FINDINGS: Pharynx and larynx: Normal. No mass or swelling. Salivary glands: No inflammation, mass, or stone. Thyroid: Unchanged 4 mm left thyroid nodule, which is clinically insignificant and for which no follow-up imaging is recommended. Otherwise normal. Lymph nodes: Redemonstrated enlarged, hypoattenuating right level 2, 3, and 4 lymph nodes, the largest of which is a right level 2B lymph node measuring up to 12 mm (series 2, image 53), all of which are unchanged. No new enlarged lymph nodes. Vascular: Patent. Limited intracranial: Negative. Visualized orbits: Negative. Mastoids and visualized paranasal sinuses: Clear. Skeleton: No acute osseous abnormality. Upper chest: No focal pulmonary opacity or pleural effusion. Other: None. IMPRESSION: Unchanged size of  enlarged right cervical lymph nodes. No new or progressive findings. Electronically Signed   By: Merilyn Baba M.D.   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  He completed 6 cycles of ABVD on October 26, 2016.  PET scan on January 24, 2017 revealed a complete metabolic response.  His most recent imaging of his neck only on February 15, 2021 reviewed independently and reported as above with no obvious evidence of recurrent or progressive disease.  No intervention is needed at this time.  Patient has been in complete remission for 5 years, therefore  no further imaging or follow-up is necessary.  Please refer patient back if there are any questions or concerns.  2.  Anxiety: I have instructed patient to further discuss with his PCP and possibly consider a referral for counseling.    I provided 20 minutes of face-to-face video visit time during this encounter which included chart review, counseling, and coordination of care as documented above.   Patient expressed understanding and was in agreement with this plan. He also understands that He can call clinic at any time with any questions, concerns, or complaints.    Cancer Staging  Nodular sclerosis Hodgkin lymphoma of lymph nodes of neck ([facility]) Staging form: Hodgkin and Non-Hodgkin Lymphoma, AJCC 8th Edition - Clinical stage from 05/09/2016: Stage III (Hodgkin lymphoma, A - Asymptomatic) - Signed by Lloyd Huger, MD on 05/09/2016   Lloyd Huger, MD   02/22/2021 5:34 AM

## 2021-02-21 ENCOUNTER — Encounter: Payer: Self-pay | Admitting: Oncology

## 2021-02-21 ENCOUNTER — Inpatient Hospital Stay (HOSPITAL_BASED_OUTPATIENT_CLINIC_OR_DEPARTMENT_OTHER): Payer: BC Managed Care – PPO | Admitting: Oncology

## 2021-02-21 ENCOUNTER — Other Ambulatory Visit: Payer: Self-pay

## 2021-02-21 DIAGNOSIS — C8111 Nodular sclerosis classical Hodgkin lymphoma, lymph nodes of head, face, and neck: Secondary | ICD-10-CM | POA: Diagnosis not present

## 2021-02-21 NOTE — Progress Notes (Signed)
Patient has no questions or concerns today.
